# Patient Record
Sex: Female | Born: 1960 | Race: Black or African American | Hispanic: No | Marital: Married | Smoking: Never smoker
Health system: Southern US, Community
[De-identification: ages and names within clinical notes are randomized; demographics above are authoritative.]

## PROBLEM LIST (undated history)

## (undated) DIAGNOSIS — F419 Anxiety disorder, unspecified: Secondary | ICD-10-CM

## (undated) DIAGNOSIS — M1990 Unspecified osteoarthritis, unspecified site: Secondary | ICD-10-CM

## (undated) HISTORY — PX: OTHER SURGICAL HISTORY: SHX169

## (undated) HISTORY — DX: Anxiety disorder, unspecified: F41.9

## (undated) HISTORY — DX: Unspecified osteoarthritis, unspecified site: M19.90

---

## 2016-08-23 ENCOUNTER — Encounter (HOSPITAL_COMMUNITY): Payer: Self-pay | Admitting: Emergency Medicine

## 2016-08-23 ENCOUNTER — Emergency Department (HOSPITAL_COMMUNITY)
Admission: EM | Admit: 2016-08-23 | Discharge: 2016-08-23 | Disposition: A | Discharge status: Home / Self Care | Payer: Self-pay | Attending: Emergency Medicine | Admitting: Emergency Medicine

## 2016-08-23 DIAGNOSIS — M791 Myalgia: Secondary | ICD-10-CM | POA: Insufficient documentation

## 2016-08-23 DIAGNOSIS — R52 Pain, unspecified: Secondary | ICD-10-CM

## 2016-08-23 DIAGNOSIS — M79671 Pain in right foot: Secondary | ICD-10-CM | POA: Insufficient documentation

## 2016-08-23 DIAGNOSIS — R202 Paresthesia of skin: Secondary | ICD-10-CM

## 2016-08-23 DIAGNOSIS — M79672 Pain in left foot: Secondary | ICD-10-CM | POA: Insufficient documentation

## 2016-08-23 DIAGNOSIS — M7918 Myalgia, other site: Secondary | ICD-10-CM

## 2016-08-23 LAB — URINALYSIS, ROUTINE W REFLEX MICROSCOPIC
BILIRUBIN URINE: NEGATIVE
GLUCOSE, UA: NEGATIVE mg/dL
Ketones, ur: NEGATIVE mg/dL
Leukocytes, UA: NEGATIVE
Nitrite: NEGATIVE
PH: 5 (ref 5.0–8.0)
Protein, ur: NEGATIVE mg/dL
SPECIFIC GRAVITY, URINE: 1.025 (ref 1.005–1.030)

## 2016-08-23 LAB — POC URINE PREG, ED: Preg Test, Ur: NEGATIVE

## 2016-08-23 LAB — BASIC METABOLIC PANEL
ANION GAP: 9 (ref 5–15)
BUN: 19 mg/dL (ref 6–20)
CALCIUM: 9.5 mg/dL (ref 8.9–10.3)
CHLORIDE: 104 mmol/L (ref 101–111)
CO2: 28 mmol/L (ref 22–32)
CREATININE: 0.74 mg/dL (ref 0.44–1.00)
GFR calc non Af Amer: >60 mL/min (ref >60)
GLUCOSE: 89 mg/dL (ref 65–99)
Potassium: 4.3 mmol/L (ref 3.5–5.1)
Sodium: 141 mmol/L (ref 135–145)

## 2016-08-23 LAB — CBC
HCT: 41 % (ref 36.0–46.0)
HEMOGLOBIN: 14.1 g/dL (ref 12.0–15.0)
MCH: 31.8 pg (ref 26.0–34.0)
MCHC: 34.4 g/dL (ref 30.0–36.0)
MCV: 92.3 fL (ref 78.0–100.0)
Platelets: 259 10*3/uL (ref 150–400)
RBC: 4.44 MIL/uL (ref 3.87–5.11)
RDW: 13.5 % (ref 11.5–15.5)
WBC: 4.6 10*3/uL (ref 4.0–10.5)

## 2016-08-23 LAB — PREGNANCY, URINE: Preg Test, Ur: NEGATIVE

## 2016-08-23 MED ORDER — METHOCARBAMOL 500 MG PO TABS: 500.0000 mg | ORAL_TABLET | Freq: Two times a day (BID) | ORAL | 0 refills | Status: DC

## 2016-08-23 MED ORDER — ACETAMINOPHEN 500 MG PO TABS
1000.0000 mg | ORAL_TABLET | Freq: Once | ORAL | Status: AC
  Administered 2016-08-23: 1000 mg via ORAL
  Filled 2016-08-23: qty 2

## 2016-08-23 MED ORDER — METHOCARBAMOL 500 MG PO TABS
500.0000 mg | ORAL_TABLET | Freq: Once | ORAL | Status: AC
  Administered 2016-08-23: 500 mg via ORAL
  Filled 2016-08-23: qty 1

## 2016-08-23 NOTE — ED Notes (Signed)
Patient was alert, oriented and stable upon discharge. RN went over AVS and patient had no further questions.  

## 2016-08-23 NOTE — Discharge Instructions (Signed)
Take medications as directed for pain.  Follow-up with one of the referred primary care doctors for further evaluation.  Return the emergency Department for any worsening pain, fever, difficulty walking, difficulty moving her arms or legs. Urinary or bowel accidents, numbness in her groin, chest pain, dental to breathing or any other worsening or concerning symptoms.  If you do not have a primary care doctor you see regularly, please you the list below. Please call them to arrange for follow-up.    No Primary Care Doctor [facility]  [phone] Other agencies that provide inexpensive medical care    [facility]  [phone]    [facility]  [phone]    [facility]  [phone]    [facility]  [phone]    [facility]  [phone]    [facility]  [phone]

## 2016-08-23 NOTE — ED Provider Notes (Signed)
[facility]-EMERGENCY DEPT Provider Note   CSN: [id number] Arrival date & time: 08/23/16  1338     History   Chief Complaint Chief Complaint  Patient presents with  . Weakness  . Foot Pain    HPI Jody Spencer is a 56 y.o. female who presents with intermittent bilateral foot pain that has been ongoing for 9 years. Patient reports that she has also been experiencing intermittent cramping, paresthesia and pain to bilateral feet and bilateral hands. Patient states that the pain is random and is not associated with any particular activity. She describes it as a "prickling sensation and sometimes feels like there are things crawling on me" and states that sometimes her foot cramps up causing pain. Patient also reports having intermittent paresthesia to the bilateral hands and feet. Patient reports that she also sometimes have some bilateral knee pain. She reports that last night the knee pain became worse, causing her legs to give out and fell. She denies any episodes were she has not been able to move or feel her bilateral upper and lower extremities. She denies any recent fall, trauma, injury. She denies any back pain, neck pain. Denies fevers, weight loss, numbness/weakness of upper and lower extremities, bowel/bladder incontinence, saddle anesthesia, history of back surgery, history of IVDA.    The history is provided by the patient.    History reviewed. No pertinent past medical history.  There are no active problems to display for this patient.   History reviewed. No pertinent surgical history.  OB History    No data available       Home Medications    Prior to Admission medications   Medication Sig Start Date End Date Taking? Authorizing Provider  ibuprofen (ADVIL,MOTRIN) 800 MG tablet Take 800 mg by mouth every 8 (eight) hours as needed for moderate pain.   Yes [provider]  methocarbamol (ROBAXIN) 500 MG tablet Take 1 tablet (500 mg total) by mouth 2 (two) times  daily. 08/23/16   Maxwell CaulLayden, Ermel Verne A, PA-C    Family History History reviewed. No pertinent family history.  Social History Social History  Substance Use Topics  . Smoking status: Never Smoker  . Smokeless tobacco: Never Used  . Alcohol use No     Allergies   Patient has no known allergies.   Review of Systems Review of Systems  Constitutional: Negative for chills and fever.  HENT: Negative for congestion, rhinorrhea and sore throat.   Respiratory: Negative for cough and shortness of breath.   Cardiovascular: Negative for chest pain.  Gastrointestinal: Negative for abdominal pain, blood in stool, diarrhea, nausea and vomiting.  Genitourinary: Negative for dysuria and hematuria.  Musculoskeletal: Negative for back pain and neck pain.  Skin: Negative for rash.  Neurological: Positive for numbness. Negative for dizziness, weakness and headaches.     Physical Exam Updated Vital Signs BP 131/86 (BP Location: Left Arm)   Pulse 78   Temp 98.5 F (36.9 C) (Oral)   Resp 16   Ht 5\' 1"  (1.549 m)   Wt 86.2 kg (190 lb)   SpO2 99%   BMI 35.90 kg/m   Physical Exam  Constitutional: She is oriented to person, place, and time. She appears well-developed and well-nourished.  Sitting comfortably on examination table  HENT:  Head: Normocephalic and atraumatic.  Mouth/Throat: Oropharynx is clear and moist and mucous membranes are normal.  Eyes: Conjunctivae, EOM and lids are normal. Pupils are equal, round, and reactive to light.  Neck: Full passive range of  motion without pain.  Full flexion/extension and lateral movement of neck fully intact. No bony midline tenderness. No deformities or crepitus. Negative Spurling's.  Cardiovascular: Normal rate, regular rhythm, normal heart sounds and normal pulses.  Exam reveals no gallop and no friction rub.   No murmur heard. Pulses:      Radial pulses are 2+ on the right side, and 2+ on the left side.       Dorsalis pedis pulses are 2+ on  the right side, and 2+ on the left side.  Pulmonary/Chest: Effort normal and breath sounds normal.  Abdominal: Soft. Normal appearance. There is no tenderness. There is no rigidity and no guarding.  Musculoskeletal: Normal range of motion.  Note C, T, L-spine midline tenderness. No deformities or crepitus noted. Full extension/flexion of back intact without any difficulty. Full range of motion of bilateral lower extremities. Full flexion/extension of bilateral knees. No overlying erythema, ecchymosis, deformity or crepitus noted to bilateral knees. Full range of motion of bilateral ankles. Dorsiflexion and plantarflexion intact without difficulty. No bilateral lower extremity edema. No calf tenderness bilaterally.  Neurological: She is alert and oriented to person, place, and time.  Cranial nerves III-XII intact Follows commands, Moves all extremities  5/5 strength to BUE and BLE  Sensation intact throughout. Patient is able to differentiate between sharp and dull throughout all major nerve distributions of the bilateral upper extremity. Normal finger to nose. No dysdiadochokinesia. No pronator drift. No gait abnormalities  No slurred speech. No facial droop. Negative straight leg raise test.   Skin: Skin is warm and dry. Capillary refill takes less than 2 seconds. No rash noted.  No evidence of rash. Bilateral feet are not cold or dusky in appearance.  Psychiatric: She has a normal mood and affect. Her speech is normal.  Nursing note and vitals reviewed.    ED Treatments / Results  Labs (all labs ordered are listed, but only abnormal results are displayed) Labs Reviewed  URINALYSIS, ROUTINE W REFLEX MICROSCOPIC - Abnormal; Notable for the following:       Result Value   Hgb urine dipstick SMALL (*)    Bacteria, UA RARE (*)    Squamous Epithelial / LPF 0-5 (*)    All other components within normal limits  BASIC METABOLIC PANEL  CBC  PREGNANCY, URINE  POC URINE PREG, ED    EKG   EKG Interpretation None       Radiology No results found.  Procedures Procedures (including critical care time)  Medications Ordered in ED Medications  acetaminophen (TYLENOL) tablet 1,000 mg (1,000 mg Oral Given 08/23/16 1658)  methocarbamol (ROBAXIN) tablet 500 mg (500 mg Oral Given 08/23/16 1800)     Initial Impression / Assessment and Plan / ED Course  I have reviewed the triage vital signs and the nursing notes.  Pertinent labs & imaging results that were available during my care of the patient were reviewed by me and considered in my medical decision making (see chart for details).     56 year old female who presents with 9 years of chronic foot pain, cramping, paresthesia. Patient is afebrile, non-toxic appearing, sitting comfortably on examination table. Vital signs reviewed and stable. No history of trauma or fall. No red flag warning symptoms on exam. No neuro deficits on exam. Patient is able to walk the department without any difficulty. There is mention of triage note of patient being weak. When asked about this patient tells me that she had one episode last night where her leg  pain became severe and caught her off guard that it caused her legs to give out. She denies any other episodes of weakness. History/physical exam are not concerning for cauda equina, spinal abscess, DVT, Septic arthritis, acute arterial embolism. Patient does not have pain over specific bone or joint. Her pain is mostly diffuse. Low suspicion for acute fracture dislocation, especially given lack of fall or trauma.  Consider musculoskeletal pain versus electrolyte abnormality versus radiculopathy versus sciatica. Basic labs including, CBC, BMP, UA ordered at triage. Will plan to obtain urine pregnancy. Patient given analgesics in the department.  Labs and imaging reviewed. CMP without any abnormalities. CBC unremarkable. Urine shows small bacteria but likely contaminated specimen. Urine pregnancy  negative. Discussed results with patient. She reports improvement in pain after Tylenol use. We discussed at length regarding her symptoms and reassuring exam. She does not have primary care doctor to follow-up with. Provided patient with a list of clinic resources to use if he does not have a PCP. Instructed to call them today to arrange follow-up in the next 24-48 hours. Return precautions discussed. Patient expresses understanding and agreement to plan.     Final Clinical Impressions(s) / ED Diagnoses   Final diagnoses:  Foot pain, bilateral  Paresthesia  Generalized pain  Musculoskeletal pain    New Prescriptions New Prescriptions   METHOCARBAMOL (ROBAXIN) 500 MG TABLET    Take 1 tablet (500 mg total) by mouth 2 (two) times daily.     Maxwell Caul, PA-C 08/24/16 2138    Rolland Porter, MD 08/30/16 [phone]

## 2016-08-23 NOTE — ED Triage Notes (Signed)
Patient complaining of foot pain that started 9 years ago. Patient states that it feels like something is pricking her. Patient also states that her legs are really weak and she been loosing her balance.

## 2018-01-28 ENCOUNTER — Emergency Department (HOSPITAL_COMMUNITY)
Admission: EM | Admit: 2018-01-28 | Discharge: 2018-01-28 | Disposition: A | Discharge status: Home / Self Care | Payer: Self-pay | Attending: Emergency Medicine | Admitting: Emergency Medicine

## 2018-01-28 ENCOUNTER — Encounter (HOSPITAL_COMMUNITY): Payer: Self-pay | Admitting: Physician Assistant

## 2018-01-28 ENCOUNTER — Encounter (HOSPITAL_COMMUNITY): Payer: Self-pay | Admitting: Emergency Medicine

## 2018-01-28 DIAGNOSIS — Y33XXXA Other specified events, undetermined intent, initial encounter: Secondary | ICD-10-CM | POA: Insufficient documentation

## 2018-01-28 DIAGNOSIS — Y9389 Activity, other specified: Secondary | ICD-10-CM | POA: Insufficient documentation

## 2018-01-28 DIAGNOSIS — S161XXA Strain of muscle, fascia and tendon at neck level, initial encounter: Secondary | ICD-10-CM | POA: Insufficient documentation

## 2018-01-28 DIAGNOSIS — Y9289 Other specified places as the place of occurrence of the external cause: Secondary | ICD-10-CM | POA: Insufficient documentation

## 2018-01-28 DIAGNOSIS — Y990 Civilian activity done for income or pay: Secondary | ICD-10-CM | POA: Insufficient documentation

## 2018-01-28 MED ORDER — CYCLOBENZAPRINE HCL 10 MG PO TABS: 10.0000 mg | ORAL_TABLET | Freq: Two times a day (BID) | ORAL | 0 refills | Status: DC | PRN

## 2018-01-28 NOTE — ED Provider Notes (Signed)
[facility] EMERGENCY DEPARTMENT Provider Note   CSN: [id number] Arrival date & time: 01/28/18  0865     History   Chief Complaint No chief complaint on file.   HPI Jody Spencer is a 57 y.o. female.  HPI   57 year old female presents today with left-sided neck pain.  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  She notes this slowly developed with worsening pain and stiffness.  She notes that when she moves her neck in any direction she has pain in the left side of her neck.  She denies any posterior midline tenderness, denies any involvement of the head, no distal neurological deficits.  She notes taking a unknown muscle relaxer at home along with Goody powders with no significant improvement in symptoms.  History reviewed. No pertinent past medical history.  There are no active problems to display for this patient.   History reviewed. No pertinent surgical history.   OB History   No obstetric history on file.      Home Medications    Prior to Admission medications   Medication Sig Start Date End Date Taking? Authorizing Provider  cyclobenzaprine (FLEXERIL) 10 MG tablet Take 1 tablet (10 mg total) by mouth 2 (two) times daily as needed for muscle spasms. 01/28/18   Eyvonne Mechanic, PA-C    Family History History reviewed. No pertinent family history.  Social History Social History   Tobacco Use  . Smoking status: Not on file  Substance Use Topics  . Alcohol use: Not on file  . Drug use: Not on file     Allergies   Patient has no allergy information on record.   Review of Systems Review of Systems  All other systems reviewed and are negative.    Physical Exam Updated Vital Signs BP (!) 142/88   Pulse 95   Temp 98.8 F (37.1 C) (Oral)   Resp 18   SpO2 98%   Physical Exam Vitals signs and nursing note reviewed.  Constitutional:      Appearance: She is  well-developed.  HENT:     Head: Normocephalic and atraumatic.  Eyes:     General: No scleral icterus.       Right eye: No discharge.        Left eye: No discharge.     Conjunctiva/sclera: Conjunctivae normal.     Pupils: Pupils are equal, round, and reactive to light.  Neck:     Musculoskeletal: Normal range of motion.     Vascular: No JVD.     Trachea: No tracheal deviation.  Pulmonary:     Effort: Pulmonary effort is normal.     Breath sounds: No stridor.  Musculoskeletal:     Comments: Tenderness palpation of left lateral cervical musculature, no midline tenderness, no swelling or obvious warmness of the neck-bilateral upper extremity sensation and motor function intact radial pulse 2+  Neurological:     Mental Status: She is alert and oriented to person, place, and time.     Coordination: Coordination normal.  Psychiatric:        Behavior: Behavior normal.        Thought Content: Thought content normal.        Judgment: Judgment normal.      ED Treatments / Results  Labs (all labs ordered are listed, but only abnormal results are displayed) Labs Reviewed - No data to display  EKG None  Radiology No results found.  Procedures Procedures (including critical care time)  Medications Ordered in ED Medications - No data to display   Initial Impression / Assessment and Plan / ED Course  I have reviewed the triage vital signs and the nursing notes.  Pertinent labs & imaging results that were available during my care of the patient were reviewed by me and considered in my medical decision making (see chart for details).     57 year old female presents today with likely cervical strain.  I have very low suspicion for vascular or significant neurological involvement.  Symptomatic care instructions given outpatient follow-up information given.  Patient verbalized understanding and agreement to today's plan had no further questions or concerns.  Final Clinical  Impressions(s) / ED Diagnoses   Final diagnoses:  Strain of neck muscle, initial encounter    ED Discharge Orders         Ordered    cyclobenzaprine (FLEXERIL) 10 MG tablet  2 times daily PRN     01/28/18 0753           Eyvonne MechanicHedges, Ramonica Grigg, PA-C 01/28/18 [id number]    Rolan BuccoBelfi, Melanie, MD 01/28/18 [phone]

## 2018-01-28 NOTE — ED Triage Notes (Signed)
Pt arrive via POV d/t work related injury. 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

## 2018-01-28 NOTE — Discharge Instructions (Signed)
Please read the attached information.  Please follow-up with either your primary care provider or workplace health care center for further evaluation and management this week, return immediately if you develop any new or worsening signs or symptoms.

## 2018-02-18 ENCOUNTER — Ambulatory Visit: Payer: Self-pay

## 2018-02-18 ENCOUNTER — Other Ambulatory Visit: Payer: Self-pay | Admitting: Family Medicine

## 2018-02-18 DIAGNOSIS — M542 Cervicalgia: Secondary | ICD-10-CM

## 2018-03-26 ENCOUNTER — Emergency Department (HOSPITAL_COMMUNITY)
Admission: EM | Admit: 2018-03-26 | Discharge: 2018-03-26 | Disposition: A | Discharge status: Home / Self Care | Payer: Medicaid Other | Attending: Emergency Medicine | Admitting: Emergency Medicine

## 2018-03-26 ENCOUNTER — Encounter (HOSPITAL_COMMUNITY): Payer: Self-pay

## 2018-03-26 ENCOUNTER — Emergency Department (HOSPITAL_COMMUNITY): Payer: Medicaid Other

## 2018-03-26 DIAGNOSIS — J069 Acute upper respiratory infection, unspecified: Secondary | ICD-10-CM

## 2018-03-26 LAB — GROUP A STREP BY PCR: Group A Strep by PCR: NOT DETECTED

## 2018-03-26 MED ORDER — ALBUTEROL SULFATE HFA 108 (90 BASE) MCG/ACT IN AERS: 1.0000 | INHALATION_SPRAY | Freq: Four times a day (QID) | RESPIRATORY_TRACT | 0 refills | Status: AC | PRN

## 2018-03-26 MED ORDER — AZITHROMYCIN 250 MG PO TABS: 250.0000 mg | ORAL_TABLET | Freq: Every day | ORAL | 0 refills | Status: DC

## 2018-03-26 MED ORDER — KETOROLAC TROMETHAMINE 15 MG/ML IJ SOLN
15.0000 mg | Freq: Once | INTRAMUSCULAR | Status: AC
Start: 2018-03-26 — End: 2018-03-26
  Administered 2018-03-26: 15 mg via INTRAMUSCULAR
  Filled 2018-03-26: qty 1

## 2018-03-26 NOTE — ED Notes (Signed)
Patient verbalizes understanding of discharge instructions. Opportunity for questioning and answers were provided. Armband removed by staff, pt discharged from ED.  

## 2018-03-26 NOTE — ED Triage Notes (Signed)
Pt reports flu like symptoms (NP cough, sore throat, headache, body aches, nausea) for 3 days. NAD noted in triage

## 2018-03-26 NOTE — ED Provider Notes (Signed)
[facility] EMERGENCY DEPARTMENT Provider Note   CSN: [id number] Arrival date & time: 03/26/18  1052     History   Chief Complaint Chief Complaint  Patient presents with  . Generalized Body Aches  . Cough  . Headache    HPI Jody Spencer is a 58 y.o. female.  58 year old female with prior medical history as detailed below presents for evaluation of URI symptoms.  Patient reports 2 days of congestion, cough, sore throat, malaise, fatigue, subjective fever, and chills.  Patient is medicating at home with Tylenol and Motrin.  Patient reports that she has had intermittent episodes of wheezing as well.  She denies current shortness of breath.  She denies nausea, vomiting, abdominal pain, chest pain, bowel movement change, or urinary symptoms.  Patient is requesting a course of antibiotics for treatment of her symptoms.  The history is provided by the patient and medical records.  Cough  Cough characteristics:  Dry Sputum characteristics:  Nondescript Severity:  Mild Onset quality:  Gradual Duration:  2 days Timing:  Constant Progression:  Waxing and waning Chronicity:  New Smoker: no   Relieved by:  Nothing Worsened by:  Nothing Ineffective treatments:  None tried Associated symptoms: headaches   Headache  Associated symptoms: cough     History reviewed. No pertinent past medical history.  There are no active problems to display for this patient.   History reviewed. No pertinent surgical history.   OB History   No obstetric history on file.      Home Medications    Prior to Admission medications   Medication Sig Start Date End Date Taking? Authorizing Provider  albuterol (PROVENTIL HFA;VENTOLIN HFA) 108 (90 Base) MCG/ACT inhaler Inhale 1-2 puffs into the lungs every 6 (six) hours as needed for wheezing or shortness of breath. 03/26/18   Wynetta Fines, MD  azithromycin (ZITHROMAX) 250 MG tablet Take 1 tablet (250 mg total) by mouth daily.  Take first 2 tablets together, then 1 every day until finished. 03/26/18   Wynetta Fines, MD  cyclobenzaprine (FLEXERIL) 10 MG tablet Take 1 tablet (10 mg total) by mouth 2 (two) times daily as needed for muscle spasms. 01/28/18   Hedges, Tinnie Gens, PA-C  ibuprofen (ADVIL,MOTRIN) 800 MG tablet Take 800 mg by mouth every 8 (eight) hours as needed for moderate pain.    [provider]  methocarbamol (ROBAXIN) 500 MG tablet Take 1 tablet (500 mg total) by mouth 2 (two) times daily. 08/23/16   Maxwell Caul, PA-C    Family History No family history on file.  Social History Social History   Tobacco Use  . Smoking status: Never Smoker  Substance Use Topics  . Alcohol use: No  . Drug use: No     Allergies   Patient has no allergy information on record.   Review of Systems Review of Systems  Respiratory: Positive for cough.   Neurological: Positive for headaches.  All other systems reviewed and are negative.    Physical Exam Updated Vital Signs BP 104/66 (BP Location: Right Arm)   Pulse 88   Temp 99.7 F (37.6 C) (Oral)   Resp 20   SpO2 96%   Physical Exam Vitals signs and nursing note reviewed.  Constitutional:      General: She is not in acute distress.    Appearance: She is well-developed.  HENT:     Head: Normocephalic and atraumatic.     Mouth/Throat:     Comments: Mild posterior pharyngeal  erythema.  No exudates.  No peritonsillar abscess noted. Eyes:     General: No visual field deficit.    Extraocular Movements: Extraocular movements intact.     Conjunctiva/sclera: Conjunctivae normal.     Pupils: Pupils are equal, round, and reactive to light.  Neck:     Musculoskeletal: Normal range of motion and neck supple.  Cardiovascular:     Rate and Rhythm: Normal rate and regular rhythm.     Heart sounds: Normal heart sounds.  Pulmonary:     Effort: Pulmonary effort is normal. No respiratory distress.     Breath sounds: Normal breath sounds.    Abdominal:     General: There is no distension.     Palpations: Abdomen is soft.     Tenderness: There is no abdominal tenderness.  Musculoskeletal: Normal range of motion.        General: No deformity.  Skin:    General: Skin is warm and dry.  Neurological:     Mental Status: She is alert and oriented to person, place, and time. Mental status is at baseline.     GCS: GCS eye subscore is 4. GCS verbal subscore is 5. GCS motor subscore is 6.     Cranial Nerves: No cranial nerve deficit, dysarthria or facial asymmetry.  Psychiatric:        Mood and Affect: Mood normal.      ED Treatments / Results  Labs (all labs ordered are listed, but only abnormal results are displayed) Labs Reviewed  GROUP A STREP BY PCR    EKG None  Radiology Dg Chest 2 View  Result Date: 03/26/2018 CLINICAL DATA:  Cough and congestion. EXAM: CHEST - 2 VIEW COMPARISON:  None. FINDINGS: The lungs are clear. Heart size is normal. No pneumothorax or pleural effusion. No acute or focal bony abnormality. Thoracic spondylosis noted. IMPRESSION: No acute disease. Electronically Signed   By: Drusilla Kannerhomas  Dalessio M.D.   On: 03/26/2018 11:58    Procedures Procedures (including critical care time)  Medications Ordered in ED Medications  ketorolac (TORADOL) 15 MG/ML injection 15 mg (has no administration in time range)     Initial Impression / Assessment and Plan / ED Course  I have reviewed the triage vital signs and the nursing notes.  Pertinent labs & imaging results that were available during my care of the patient were reviewed by me and considered in my medical decision making (see chart for details).     MDM  Screen complete  Patient is presenting for evaluation of symptoms likely secondary to a viral upper respiratory infection.  Patient without evidence of more significant acute pathology on work-up or screening exam.  Patient does report intermittent episodes of wheezing.  She is willing to  trial albuterol for symptom medic treatment.  She is not currently wheezing and is unlikely to benefit from prednisone burst.  Patient is insistent upon requesting antibiotic course for treatment of her viral syndrome.  Patient is advised that the antibiotics will not help.  Given patient's persistence will prescribe a course of a Z-Pak.  She is advised to at least give herself 2 to 3 days to see if she improves prior to taking the antibiotics.  Importance of close follow-up was stressed.  Strict return precautions given and understood.  Final Clinical Impressions(s) / ED Diagnoses   Final diagnoses:  Viral upper respiratory tract infection    ED Discharge Orders         Ordered    albuterol (  PROVENTIL HFA;VENTOLIN HFA) 108 (90 Base) MCG/ACT inhaler  Every 6 hours PRN     03/26/18 1237    azithromycin (ZITHROMAX) 250 MG tablet  Daily     03/26/18 1237           Wynetta Fines, MD 03/26/18 1250

## 2018-03-26 NOTE — Discharge Instructions (Signed)
Return for any problem.  Follow-up with your regular care providers as instructed.  Use Tylenol, Motrin, and drink plenty of fluids for treatment of your symptoms.

## 2018-09-06 ENCOUNTER — Other Ambulatory Visit: Payer: Self-pay

## 2018-09-06 ENCOUNTER — Emergency Department (HOSPITAL_COMMUNITY): Payer: Self-pay

## 2018-09-06 ENCOUNTER — Emergency Department (HOSPITAL_COMMUNITY)
Admission: EM | Admit: 2018-09-06 | Discharge: 2018-09-06 | Disposition: A | Discharge status: Home / Self Care | Payer: Self-pay | Attending: Emergency Medicine | Admitting: Emergency Medicine

## 2018-09-06 DIAGNOSIS — M545 Low back pain, unspecified: Secondary | ICD-10-CM

## 2018-09-06 DIAGNOSIS — Z79899 Other long term (current) drug therapy: Secondary | ICD-10-CM | POA: Diagnosis not present

## 2018-09-06 DIAGNOSIS — M546 Pain in thoracic spine: Secondary | ICD-10-CM | POA: Diagnosis present

## 2018-09-06 MED ORDER — LIDOCAINE 5 % EX PTCH
2.0000 | MEDICATED_PATCH | CUTANEOUS | Status: DC
  Administered 2018-09-06: 12:00:00 2 via TRANSDERMAL
  Filled 2018-09-06: qty 2

## 2018-09-06 MED ORDER — METHOCARBAMOL 500 MG PO TABS: 500.0000 mg | ORAL_TABLET | Freq: Two times a day (BID) | ORAL | 0 refills | Status: DC

## 2018-09-06 MED ORDER — NAPROXEN 500 MG PO TABS: 500.0000 mg | ORAL_TABLET | Freq: Two times a day (BID) | ORAL | 0 refills | Status: DC

## 2018-09-06 MED ORDER — NAPROXEN 250 MG PO TABS
500.0000 mg | ORAL_TABLET | Freq: Once | ORAL | Status: AC
  Administered 2018-09-06: 12:00:00 500 mg via ORAL
  Filled 2018-09-06: qty 2

## 2018-09-06 NOTE — Discharge Instructions (Signed)

## 2018-09-06 NOTE — ED Notes (Signed)
Patient transported to X-ray 
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
## 2018-09-06 NOTE — ED Notes (Signed)
Scanner not working in room

## 2018-09-06 NOTE — ED Notes (Signed)
Patient back in room from xray. No distress observed.

## 2019-04-30 ENCOUNTER — Other Ambulatory Visit: Payer: Self-pay

## 2019-04-30 DIAGNOSIS — Z1231 Encounter for screening mammogram for malignant neoplasm of breast: Secondary | ICD-10-CM

## 2019-05-15 ENCOUNTER — Ambulatory Visit: Payer: Medicaid Other

## 2019-06-05 ENCOUNTER — Other Ambulatory Visit: Payer: Self-pay

## 2019-06-05 ENCOUNTER — Ambulatory Visit: Payer: Medicaid Other

## 2019-06-05 ENCOUNTER — Ambulatory Visit
Admission: RE | Admit: 2019-06-05 | Discharge: 2019-06-05 | Disposition: A | Discharge status: Home / Self Care | Payer: No Typology Code available for payment source | Source: Ambulatory Visit | Attending: Obstetrics and Gynecology | Admitting: Obstetrics and Gynecology

## 2019-06-05 DIAGNOSIS — Z1231 Encounter for screening mammogram for malignant neoplasm of breast: Secondary | ICD-10-CM

## 2019-06-09 ENCOUNTER — Telehealth: Payer: Self-pay

## 2019-06-09 NOTE — Telephone Encounter (Signed)
Patient called and left message on voicemail, requesting a return call. Left message on identifying voicemail requesting patient to return call to office.

## 2021-07-02 ENCOUNTER — Ambulatory Visit (HOSPITAL_COMMUNITY)
Admission: EM | Admit: 2021-07-02 | Discharge: 2021-07-02 | Disposition: A | Discharge status: Home / Self Care | Payer: No Typology Code available for payment source | Attending: Physician Assistant | Admitting: Physician Assistant

## 2021-07-02 ENCOUNTER — Encounter (HOSPITAL_COMMUNITY): Payer: Self-pay | Admitting: *Deleted

## 2021-07-02 DIAGNOSIS — L03213 Periorbital cellulitis: Secondary | ICD-10-CM

## 2021-07-02 MED ORDER — TETRACAINE HCL 0.5 % OP SOLN
OPHTHALMIC | Status: AC
  Filled 2021-07-02: qty 4

## 2021-07-02 MED ORDER — AMOXICILLIN-POT CLAVULANATE 875-125 MG PO TABS: 1.0000 | ORAL_TABLET | Freq: Two times a day (BID) | ORAL | 0 refills | Status: AC

## 2021-07-02 NOTE — ED Provider Notes (Signed)
[facility]    CSN: [id number] Arrival date & time: 07/02/21  1357      History   Chief Complaint Chief Complaint  Patient presents with   Eye Problem    HPI Jody Spencer is a 61 y.o. female.   61 yo pleasant patient presents with left lower eyelid irritation, swelling, redness x 3 days. Started after applying glue for eyelashes. She has been trying ibuprofen and hot compresses with some relief, but wakes up in the mornings with symptoms returning. Vision is not affected at all per patient. No contact lenses. No pain with eye movements. No fevers or eye discharge.   History reviewed. No pertinent past medical history.  There are no problems to display for this patient.   History reviewed. No pertinent surgical history.  OB History   No obstetric history on file.      Home Medications    Prior to Admission medications   Medication Sig Start Date End Date Taking? Authorizing Provider  amoxicillin-clavulanate (AUGMENTIN) 875-125 MG tablet Take 1 tablet by mouth 2 (two) times daily for 7 days. Take with food. 07/02/21 07/09/21 Yes Sipriano Fendley M, PA-C  ibuprofen (ADVIL,MOTRIN) 800 MG tablet Take 800 mg by mouth every 8 (eight) hours as needed for moderate pain.   Yes [provider]  albuterol (PROVENTIL HFA;VENTOLIN HFA) 108 (90 Base) MCG/ACT inhaler Inhale 1-2 puffs into the lungs every 6 (six) hours as needed for wheezing or shortness of breath. 03/26/18   Wynetta Fines, MD  azithromycin (ZITHROMAX) 250 MG tablet Take 1 tablet (250 mg total) by mouth daily. Take first 2 tablets together, then 1 every day until finished. 03/26/18   Wynetta Fines, MD  cyclobenzaprine (FLEXERIL) 10 MG tablet Take 1 tablet (10 mg total) by mouth 2 (two) times daily as needed for muscle spasms. 01/28/18   Hedges, Tinnie Gens, PA-C  methocarbamol (ROBAXIN) 500 MG tablet Take 1 tablet (500 mg total) by mouth 2 (two) times daily. 09/06/18   Dartha Lodge, PA-C  naproxen  (NAPROSYN) 500 MG tablet Take 1 tablet (500 mg total) by mouth 2 (two) times daily. 09/06/18   Dartha Lodge, PA-C    Family History Family History  Problem Relation Age of Onset   Breast cancer Cousin     Social History Social History   Tobacco Use   Smoking status: Never   Smokeless tobacco: Never  Vaping Use   Vaping Use: Never used  Substance Use Topics   Alcohol use: Yes    Comment: occasionally wine   Drug use: No     Allergies   Patient has no known allergies.   Review of Systems Review of Systems REFER TO HPI FOR PERTINENT POSITIVES AND NEGATIVES   Physical Exam Triage Vital Signs ED Triage Vitals  Enc Vitals Group     BP 07/02/21 1409 132/78     Pulse Rate 07/02/21 1409 71     Resp 07/02/21 1409 20     Temp 07/02/21 1409 97.6 F (36.4 C)     Temp Source 07/02/21 1409 Oral     SpO2 07/02/21 1409 100 %     Weight --      Height --      Head Circumference --      Peak Flow --      Pain Score 07/02/21 1411 8     Pain Loc --      Pain Edu? --      Excl. in  GC? --    No data found.  Updated Vital Signs BP 132/78   Pulse 71   Temp 97.6 F (36.4 C) (Oral)   Resp 20   SpO2 100%   Visual Acuity Right Eye Distance: 20/40 Left Eye Distance: 20/40 Bilateral Distance: 20/30  Right Eye Near:   Left Eye Near:    Bilateral Near:     Physical Exam Constitutional:      Appearance: Normal appearance.  HENT:     Nose:     Right Sinus: No maxillary sinus tenderness.     Left Sinus: Maxillary sinus tenderness present.  Eyes:     General: Vision grossly intact. No visual field deficit.       Right eye: No foreign body or discharge.        Left eye: No foreign body or discharge.     Extraocular Movements: Extraocular movements intact.     Right eye: Normal extraocular motion and no nystagmus.     Left eye: Normal extraocular motion and no nystagmus.     Conjunctiva/sclera:     Right eye: Right conjunctiva is not injected. No exudate.    Left  eye: Left conjunctiva is not injected. No exudate.    Comments: Left lower eyelid swelling and erythema   Neurological:     Mental Status: She is alert.     UC Treatments / Results  Labs (all labs ordered are listed, but only abnormal results are displayed) Labs Reviewed - No data to display  EKG   Radiology No results found.  Procedures Procedures (including critical care time)  Medications Ordered in UC Medications - No data to display  Initial Impression / Assessment and Plan / UC Course  I have reviewed the triage vital signs and the nursing notes.  Pertinent labs & imaging results that were available during my care of the patient were reviewed by me and considered in my medical decision making (see chart for details).   Left lower eyelid preseptal cellulitis secondary to recent lash glue. Vision intact. Concern for infection beginning to extend into left maxillary sinus cavity given pain and edema over this region. Will start on Augmentin as directed. Warm compresses for comfort. OcuSOft lid scrubs. Instructed to stop wearing lashes until this resolves.  Very low threshold for ED if sudden severe pain or swelling, vision changes, fevers, or other acute change in symptoms.   Final Clinical Impressions(s) / UC Diagnoses   Final diagnoses:  Preseptal cellulitis of left lower eyelid     Discharge Instructions      Take the augmentin as directed Try OcuSoft lid scrubs (over the counter) Stop wearing lashes until resolved, stop altogether if recurrent ED right away if any severe swelling, pain, vision changes, fever, or other      ED Prescriptions     Medication Sig Dispense Auth. Provider   amoxicillin-clavulanate (AUGMENTIN) 875-125 MG tablet Take 1 tablet by mouth 2 (two) times daily for 7 days. Take with food. 14 tablet Stephanie Mcglone M, PA-C      PDMP not reviewed this encounter.   AllwardtCrist Infante, PA-C 07/02/21 1454

## 2021-07-02 NOTE — ED Triage Notes (Signed)
C/O left lower eyelid irritation, swelling, redness, x approx 3 days; states started approx 1 day after applying eye glue for eyelashes. Denies vision changes. Does not wear corrective lenses. States actual left eye globe is unremarkable.

## 2021-07-02 NOTE — Discharge Instructions (Signed)
Take the augmentin as directed Try OcuSoft lid scrubs (over the counter) Stop wearing lashes until resolved, stop altogether if recurrent ED right away if any severe swelling, pain, vision changes, fever, or other

## 2021-09-23 IMAGING — MG DIGITAL SCREENING BILAT W/ TOMO W/ CAD
8 series · 8 of 24 positions shown · non-contrast
Comparison: Previous exam(s).

CLINICAL DATA: Screening.

EXAM:
DIGITAL SCREENING BILATERAL MAMMOGRAM WITH TOMO AND CAD

[L MLO synth-2D]
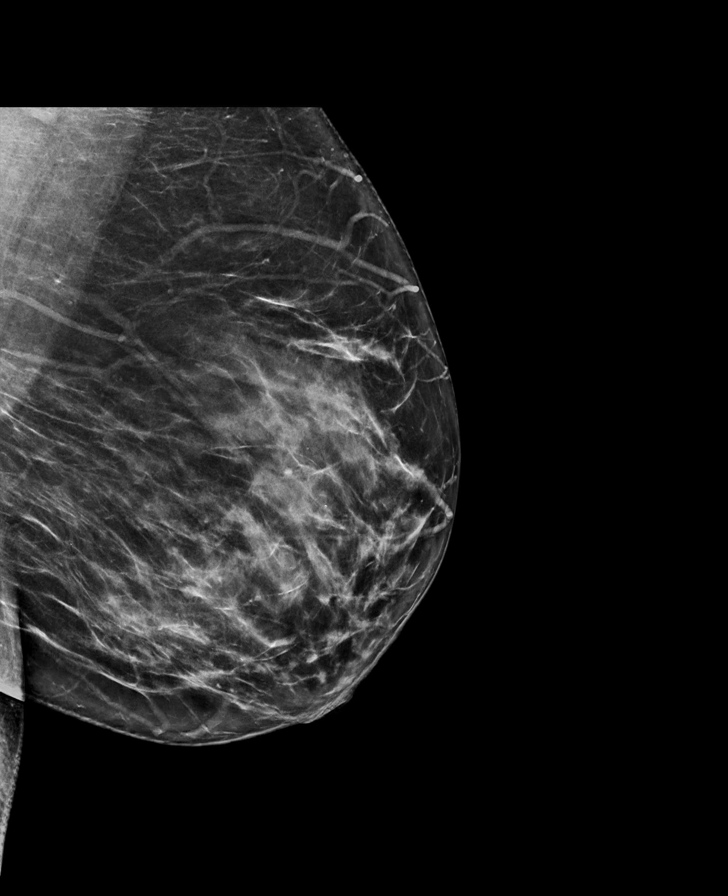

[R CC synth-2D]
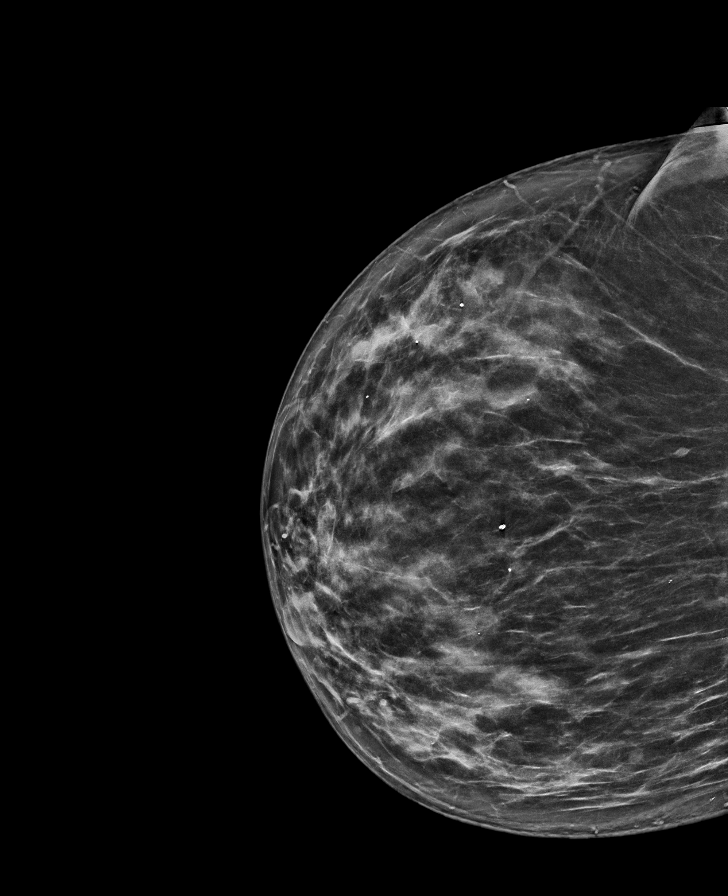

[R MLO synth-2D]
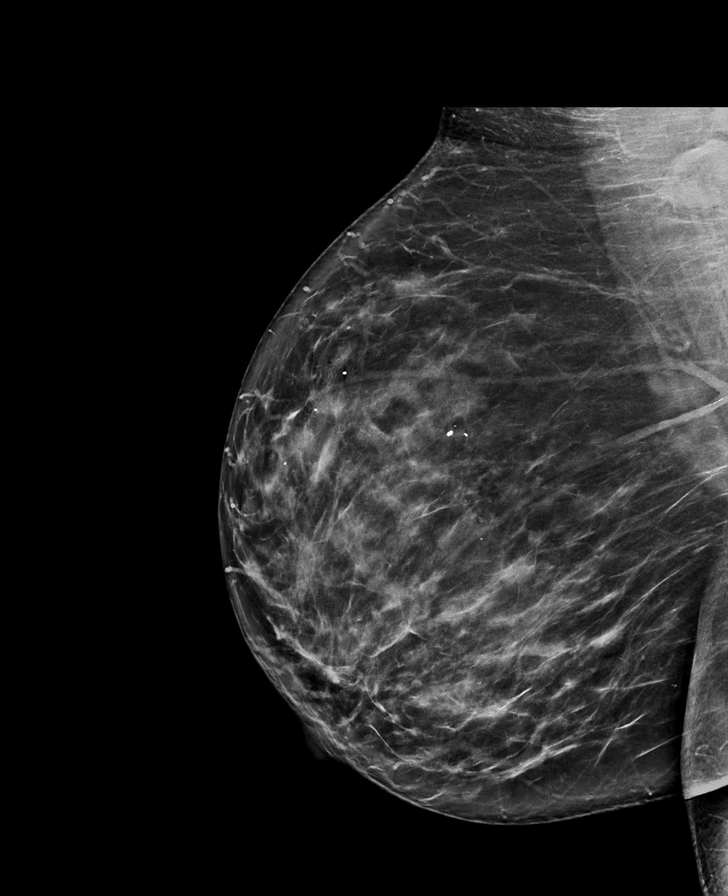

[L CC synth-2D]
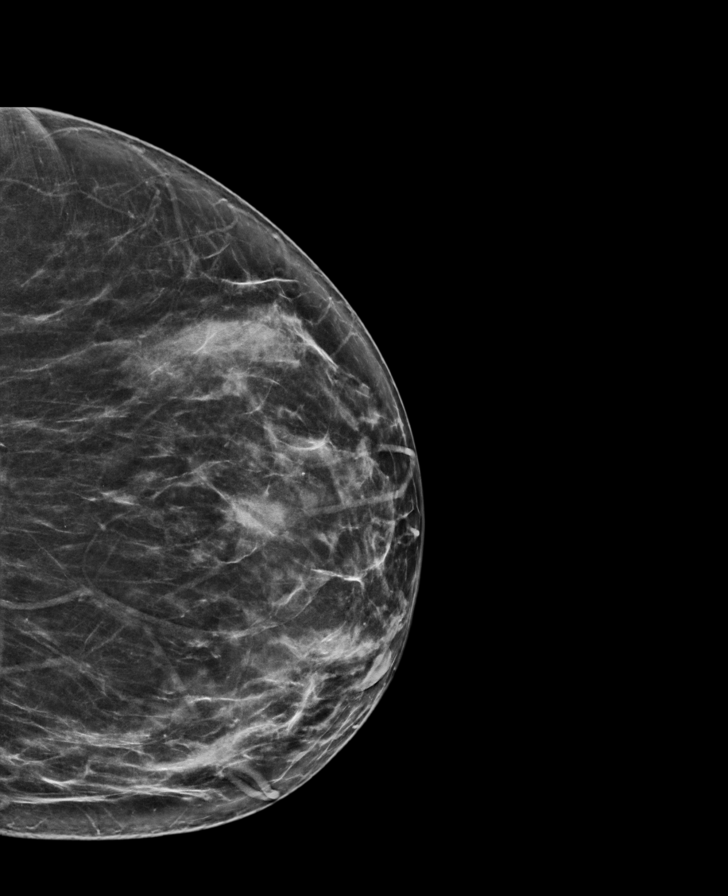

[R MLO tomo · tomo slice 43/86.0]
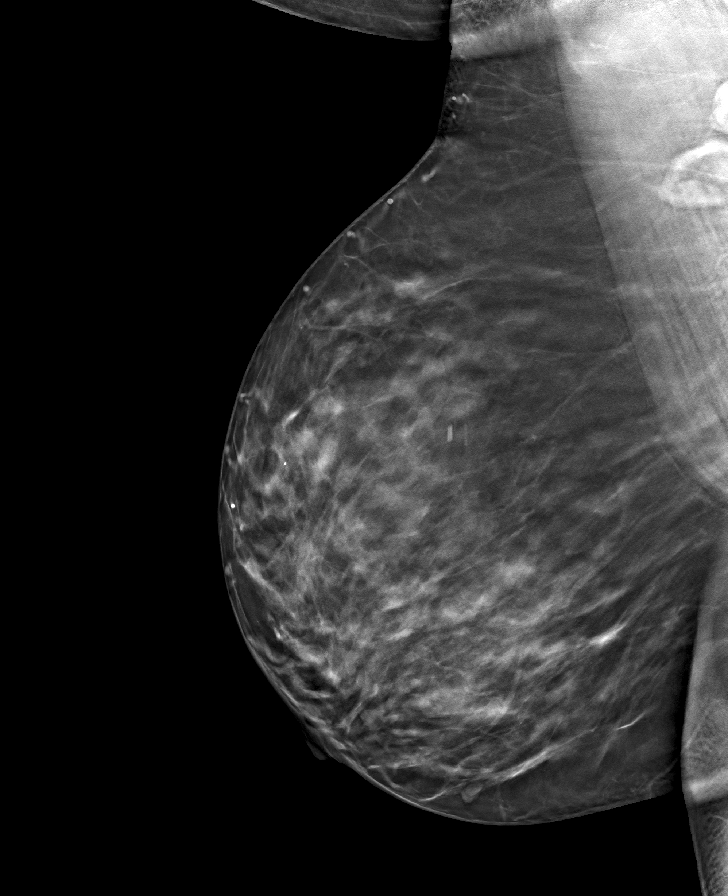

[L CC tomo · tomo slice 37/72.0]
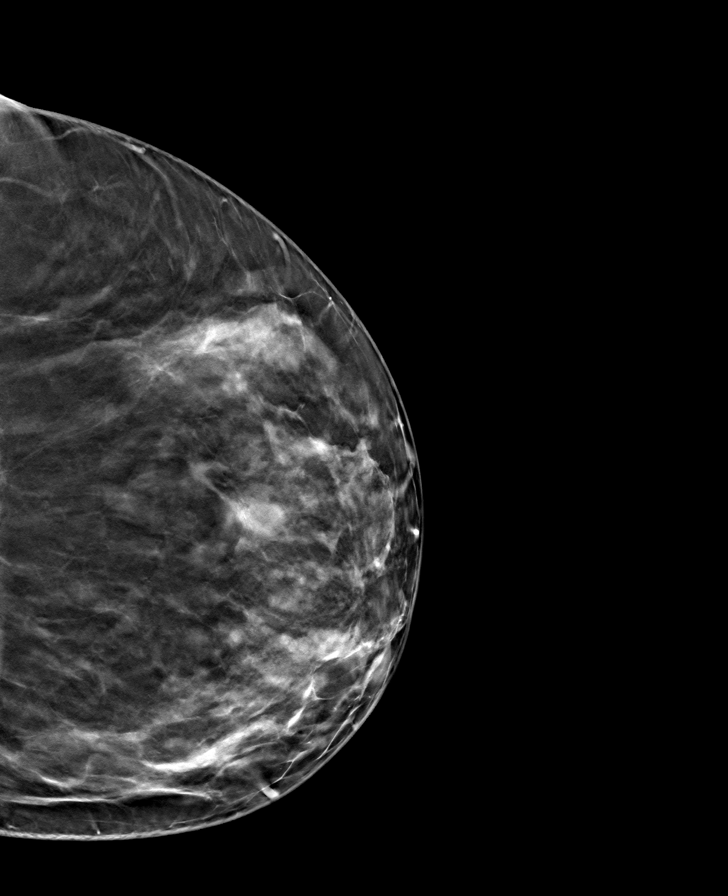

[L MLO tomo · tomo slice 43/84.0]
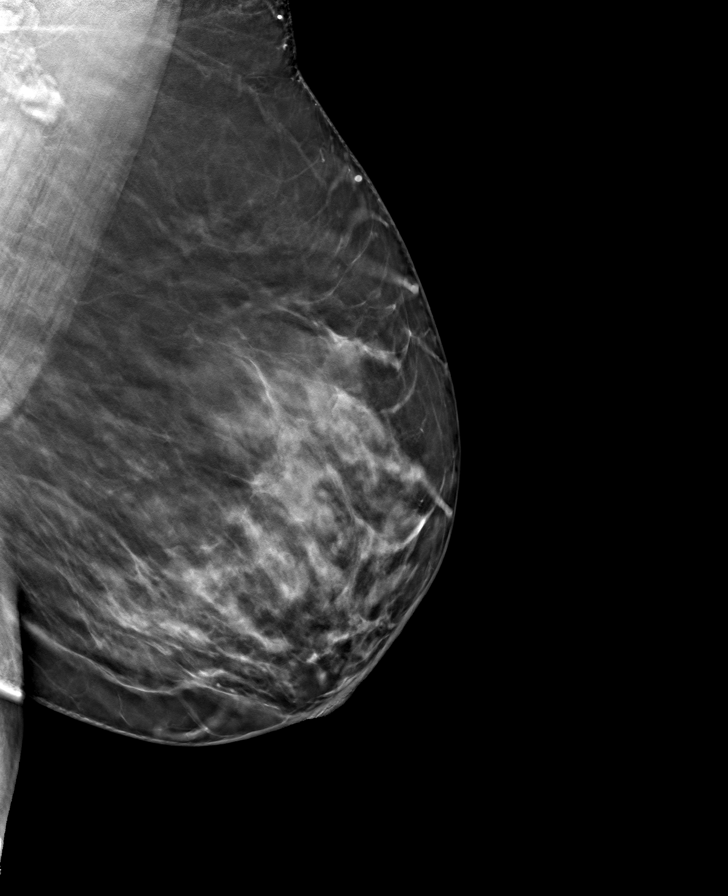

[R CC tomo · tomo slice 39/77.0]
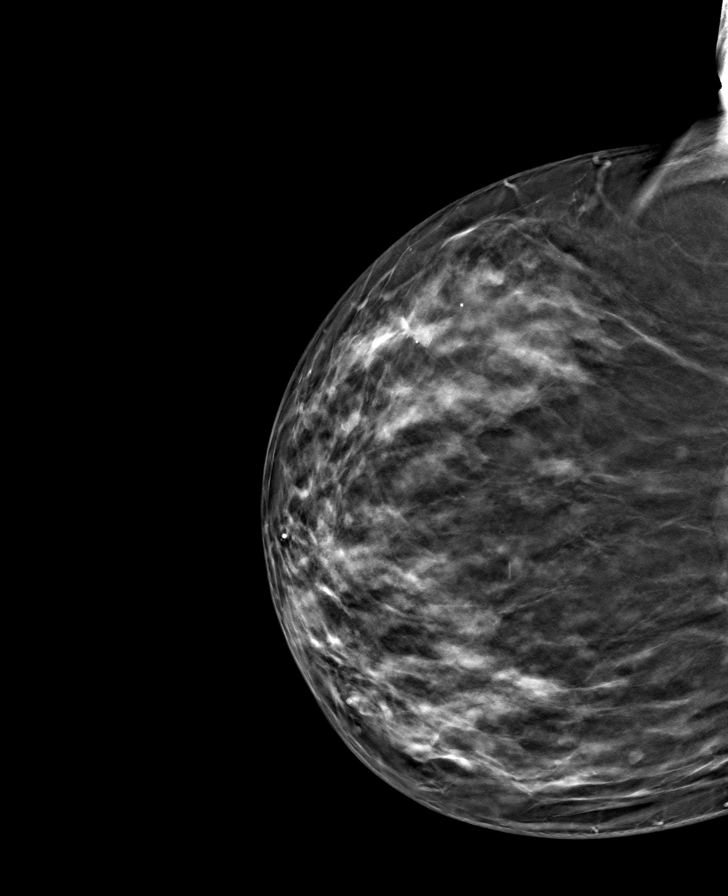

[8 of 24 positions shown; findings below may reference images not displayed]

ACR Breast Density Category c: The breast tissue is heterogeneously
dense, which may obscure small masses.
FINDINGS: There are no findings suspicious for malignancy. Images were
processed with CAD.
IMPRESSION: No mammographic evidence of malignancy. A result letter of this
screening mammogram will be mailed directly to the patient.

RECOMMENDATION:
Screening mammogram in one year. (Code:FT-U-LHB)

BI-RADS CATEGORY  1: Negative.

## 2021-10-13 ENCOUNTER — Ambulatory Visit (HOSPITAL_COMMUNITY)
Admission: EM | Admit: 2021-10-13 | Discharge: 2021-10-13 | Disposition: A | Discharge status: Home / Self Care | Payer: Self-pay | Attending: Family Medicine | Admitting: Family Medicine

## 2021-10-13 ENCOUNTER — Encounter (HOSPITAL_COMMUNITY): Payer: Self-pay | Admitting: Emergency Medicine

## 2021-10-13 DIAGNOSIS — R3121 Asymptomatic microscopic hematuria: Secondary | ICD-10-CM

## 2021-10-13 DIAGNOSIS — R252 Cramp and spasm: Secondary | ICD-10-CM

## 2021-10-13 LAB — BASIC METABOLIC PANEL
Anion gap: 7 (ref 5–15)
BUN: 19 mg/dL (ref 6–20)
CO2: 25 mmol/L (ref 22–32)
Calcium: 8.5 mg/dL — ABNORMAL LOW (ref 8.9–10.3)
Chloride: 105 mmol/L (ref 98–111)
Creatinine, Ser: 1.07 mg/dL — ABNORMAL HIGH (ref 0.44–1.00)
GFR, Estimated: 60 mL/min — ABNORMAL LOW (ref >60)
Glucose, Bld: 85 mg/dL (ref 70–99)
Potassium: 4.4 mmol/L (ref 3.5–5.1)
Sodium: 137 mmol/L (ref 135–145)

## 2021-10-13 LAB — POCT URINALYSIS DIPSTICK, ED / UC
Glucose, UA: NEGATIVE mg/dL
Leukocytes,Ua: NEGATIVE
Nitrite: NEGATIVE
Protein, ur: NEGATIVE mg/dL
Specific Gravity, Urine: 1.025 (ref 1.005–1.030)
Urobilinogen, UA: 1 mg/dL (ref 0.0–1.0)
pH: 5.5 (ref 5.0–8.0)

## 2021-10-13 LAB — CBG MONITORING, ED: Glucose-Capillary: 100 mg/dL — ABNORMAL HIGH (ref 70–99)

## 2021-10-13 MED ORDER — MELOXICAM 15 MG PO TABS: 15.0000 mg | ORAL_TABLET | Freq: Every day | ORAL | 0 refills | Status: DC

## 2021-10-13 MED ORDER — CYCLOBENZAPRINE HCL 10 MG PO TABS: ORAL_TABLET | ORAL | 0 refills | Status: AC

## 2021-10-13 NOTE — Discharge Instructions (Signed)
You have had labs (blood work and a urine culture) sent today. We will call you with any significant abnormalities or if there is need to begin or change treatment or pursue further follow up.  You may also review your test results online through MyChart. If you do not have a MyChart account, instructions to sign up should be on your discharge paperwork.  

## 2021-10-13 NOTE — ED Provider Notes (Signed)
[facility]   [id number] 10/13/21 Arrival Time: 1439  ASSESSMENT & PLAN:  1. Muscle cramps   2. Muscle cramps at night   3. Asymptomatic microscopic hematuria     Labs Reviewed  POCT URINALYSIS DIPSTICK, ED / UC - Abnormal; Notable for the following components:      Result Value   Bilirubin Urine SMALL (*)    Ketones, ur TRACE (*)    Hgb urine dipstick MODERATE (*)    All other components within normal limits  CBG MONITORING, ED - Abnormal; Notable for the following components:   Glucose-Capillary 100 (*)    All other components within normal limits  URINE CULTURE  BASIC METABOLIC PANEL  Urine culture and BMP pending.  Unclear cause of her muscle cramping/spasms. Trial of: Meds ordered this encounter  Medications   meloxicam (MOBIC) 15 MG tablet    Sig: Take 1 tablet (15 mg total) by mouth daily.    Dispense:  30 tablet    Refill:  0   cyclobenzaprine (FLEXERIL) 10 MG tablet    Sig: Take 1 tablet by mouth 3 times daily as needed for muscle spasm. Warning: May cause drowsiness.    Dispense:  21 tablet    Refill:  0    Follow-up Information     Placey, Chales Abrahams, NP.   Why: Keep your upcoming appointment. Need to recheck a urine sample again secondary to blood in your urine today. Contact information: [address] [place] [address] [phone]                Discussed importance of recheck urine at PCP f/u secondary to hematuria.  Reviewed expectations re: course of current medical issues. Questions answered. Outlined signs and symptoms indicating need for more acute intervention. Patient verbalized understanding. After Visit Summary given.   SUBJECTIVE:  Jody Spencer is a 61 y.o. female who has been without health insurance for quite awhile present with complaint of bilateral leg pains x 15 years; daily; keeps her up at night; difficult for her to describe. Ibuprofen with occasional help "but not helping as much now". No  injuries. No specific numbness/tingling of lower extremities. Normal ambulation. No balance issues. Also reports urinary frequency x 2-3 years; no dysuria; no hematuria; + nocturia several times per evening.  Has PCP appt in September.  Social History   Substance and Sexual Activity  Alcohol Use Yes   Comment: occasionally wine   Social History   Tobacco Use  Smoking Status Never  Smokeless Tobacco Never   Denies recreational drug use.  OBJECTIVE:  Vitals:   10/13/21 1500  BP: 123/85  Pulse: 80  Resp: 19  SpO2: 95%    General appearance: alert; no distress Eyes: PERRLA; EOMI; conjunctiva normal HENT: normocephalic; atraumatic; TMs normal; nasal mucosa normal; oral mucosa normal Neck: supple with FROM Lungs: clear to auscultation bilaterally Heart: regular rate and rhythm Abdomen: soft, non-tender; bowel sounds normal Extremities: no cyanosis or edema; symmetrical with no gross deformities Skin: warm and dry Neurologic: normal gait; DTR's normal and symmetric; CN 2-12 grossly intact Psychological: alert and cooperative; normal mood and affect   No Known Allergies  History reviewed. No pertinent past medical history. Social History   Socioeconomic History   Marital status: Married    Spouse name: Not on file   Number of children: Not on file   Years of education: Not on file   Highest education level: Not on file  Occupational History   Not  on file  Tobacco Use   Smoking status: Never   Smokeless tobacco: Never  Vaping Use   Vaping Use: Never used  Substance and Sexual Activity   Alcohol use: Yes    Comment: occasionally wine   Drug use: No   Sexual activity: Not on file  Other Topics Concern   Not on file  Social History Narrative   ** Merged History Encounter **       Social Determinants of Health   Financial Resource Strain: Not on file  Food Insecurity: Not on file  Transportation Needs: Not on file  Physical Activity: Not on file  Stress:  Not on file  Social Connections: Not on file  Intimate Partner Violence: Not on file   Family History  Problem Relation Age of Onset   Breast cancer Cousin    History reviewed. No pertinent surgical history.     Mardella Layman, MD 10/13/21 [phone]
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
## 2021-10-15 LAB — URINE CULTURE

## 2021-10-21 ENCOUNTER — Encounter (HOSPITAL_COMMUNITY): Payer: Self-pay | Admitting: Emergency Medicine

## 2021-10-21 ENCOUNTER — Emergency Department (HOSPITAL_COMMUNITY)
Admission: EM | Admit: 2021-10-21 | Discharge: 2021-10-21 | Disposition: A | Discharge status: Home / Self Care | Payer: Self-pay | Attending: Emergency Medicine | Admitting: Emergency Medicine

## 2021-10-21 ENCOUNTER — Emergency Department (HOSPITAL_COMMUNITY): Payer: Self-pay

## 2021-10-21 DIAGNOSIS — M79604 Pain in right leg: Secondary | ICD-10-CM | POA: Insufficient documentation

## 2021-10-21 DIAGNOSIS — M79605 Pain in left leg: Secondary | ICD-10-CM | POA: Insufficient documentation

## 2021-10-21 MED ORDER — ACETAMINOPHEN 500 MG PO TABS
1000.0000 mg | ORAL_TABLET | Freq: Once | ORAL | Status: AC
  Administered 2021-10-21: 1000 mg via ORAL
  Filled 2021-10-21: qty 2

## 2021-10-21 MED ORDER — IBUPROFEN 800 MG PO TABS
800.0000 mg | ORAL_TABLET | Freq: Once | ORAL | Status: AC
  Administered 2021-10-21: 800 mg via ORAL
  Filled 2021-10-21: qty 1

## 2021-10-21 NOTE — ED Notes (Signed)
Pt refused xray 

## 2021-10-21 NOTE — ED Triage Notes (Addendum)
Patient here with complaint of bilateral leg pain that started fifteen years ago. Patient denies any recent changes to leg pain. Patient is alert, oriented, ambulatory, and in no apparent distress at this time.

## 2021-10-21 NOTE — ED Provider Triage Note (Signed)
Emergency Medicine Provider Triage Evaluation Note  Jody Spencer , a 61 y.o. female  was evaluated in triage.  Pt complains of bilateral leg pain.  Patient states that symptoms began approximately 15 years ago with bilateral knee pain.  She also states she has had bilateral leg pain for the past 3 to 4 years starting at the hips and spreading distally past the knee.  Denies any recent trauma.  She states that she has fallen 2 times in the past 3 to 4 months but states she has been able to ambulate without difficulty since the incident.  Denies fever, chills, night sweats, chest pain, shortness of breath, Donnell pain, nausea, vomiting, urinary symptoms, change in bowel habits.  Denies saddle anesthesia, bowel/bladder dysfunction, weakness/sensory deficits in lower extremities, IV drug use history, known cancer diagnosis.  Review of Systems  Positive: See above Negative:   Physical Exam  BP 117/71 (BP Location: Right Arm)   Pulse 80   Temp 98.2 F (36.8 C) (Oral)   Resp 16   SpO2 98%  Gen:   Awake, no distress   Resp:  Normal effort  MSK:   Moves extremities without difficulty  Other:  Mild lumbar tenderness to palpation.  Patient has full range of motion of bilateral hips, knees, ankles.  No sensory deficits along major nerve distributions of lower extremities.  Strength symmetric and equal bilaterally.  Medical Decision Making  Medically screening exam initiated at 4:10 PM.  Appropriate orders placed.  Galina Haddox was informed that the remainder of the evaluation will be completed by another provider, this initial triage assessment does not replace that evaluation, and the importance of remaining in the ED until their evaluation is complete.     Peter Garter, [place] 10/21/21 1615

## 2021-10-21 NOTE — ED Notes (Signed)
Patient verbalizes understanding of discharge instructions. Opportunity for questioning and answers were provided. Armband removed by staff, pt discharged from ED.  

## 2021-10-21 NOTE — Discharge Instructions (Signed)
Take 4 over the counter ibuprofen tablets 3 times a day or 2 over-the-counter naproxen tablets twice a day for pain. Also take tylenol 1000mg (2 extra strength) four times a day.     Return for redness, fever.

## 2021-10-21 NOTE — ED Provider Notes (Signed)
[facility] EMERGENCY DEPARTMENT Provider Note   CSN: [id number] Arrival date & time: 10/21/21  1524     History  Chief Complaint  Patient presents with   Leg Pain    Jody Spencer is a 61 y.o. female.  61 yo F with a chief complaints of bilateral leg pain.  This been going on for at least 12 years.  She tells me that both of her legs ache from time to time.  Usually this is worse at night.  All over the legs at times but worse in the knees currently.  She denies trauma.  Denies back pain denies loss of bowel or bladder denies also partial sensation denies numbness or weakness to the legs.  She tells me that she seen multiple doctors and they have not been able to tell her what is wrong.  She would like x-rays today to see if they would help.   Leg Pain      Home Medications Prior to Admission medications   Medication Sig Start Date End Date Taking? Authorizing Provider  albuterol (PROVENTIL HFA;VENTOLIN HFA) 108 (90 Base) MCG/ACT inhaler Inhale 1-2 puffs into the lungs every 6 (six) hours as needed for wheezing or shortness of breath. 03/26/18   Wynetta Fines, MD  cyclobenzaprine (FLEXERIL) 10 MG tablet Take 1 tablet by mouth 3 times daily as needed for muscle spasm. Warning: May cause drowsiness. 10/13/21   Mardella Layman, MD  meloxicam (MOBIC) 15 MG tablet Take 1 tablet (15 mg total) by mouth daily. 10/13/21   Mardella Layman, MD      Allergies    Patient has no known allergies.    Review of Systems   Review of Systems  Physical Exam Updated Vital Signs BP (!) 147/88   Pulse 61   Temp (!) 97.5 F (36.4 C) (Oral)   Resp 18   SpO2 96%  Physical Exam Vitals and nursing note reviewed.  Constitutional:      General: She is not in acute distress.    Appearance: She is well-developed. She is not diaphoretic.  HENT:     Head: Normocephalic and atraumatic.  Eyes:     Pupils: Pupils are equal, round, and reactive to light.  Cardiovascular:     Rate  and Rhythm: Normal rate and regular rhythm.     Heart sounds: No murmur heard.    No friction rub. No gallop.  Pulmonary:     Effort: Pulmonary effort is normal.     Breath sounds: No wheezing or rales.  Abdominal:     General: There is no distension.     Palpations: Abdomen is soft.     Tenderness: There is no abdominal tenderness.  Musculoskeletal:        General: No tenderness.     Cervical back: Normal range of motion and neck supple.     Comments: Pulse motor and sensation intact bilateral lower extremities.  Reflexes are 2+ and equal.  Negative straight leg raise test bilaterally.  There is no ligamentous laxity appreciated to the knees.  No knee effusion.  No erythema no warmth.  No pain with range of motion of the hips.  Skin:    General: Skin is warm and dry.  Neurological:     Mental Status: She is alert and oriented to person, place, and time.  Psychiatric:        Behavior: Behavior normal.     ED Results / Procedures / Treatments   Labs (all  labs ordered are listed, but only abnormal results are displayed) Labs Reviewed - No data to display  EKG None  Radiology No results found.  Procedures Procedures    Medications Ordered in ED Medications  acetaminophen (TYLENOL) tablet 1,000 mg (1,000 mg Oral Given 10/21/21 1939)  ibuprofen (ADVIL) tablet 800 mg (800 mg Oral Given 10/21/21 1939)    ED Course/ Medical Decision Making/ A&P                           Medical Decision Making Risk OTC drugs. Prescription drug management.   60 yo F with a chief complaints of bilateral leg pain.  This been going on for at least 12 years.  No concerning findings on physical exam.  Do not feel that basic imaging would be helpful at this time.  I recommend that she follow-up with her family doctor. 10:58 PM:  I have discussed the diagnosis/risks/treatment options with the patient.  Evaluation and diagnostic testing in the emergency department does not suggest an emergent  condition requiring admission or immediate intervention beyond what has been performed at this time.  They will follow up with  PCP. We also discussed returning to the ED immediately if new or worsening sx occur. We discussed the sx which are most concerning (e.g., sudden worsening pain, fever, inability to tolerate by mouth) that necessitate immediate return. Medications administered to the patient during their visit and any new prescriptions provided to the patient are listed below.  Medications given during this visit Medications  acetaminophen (TYLENOL) tablet 1,000 mg (1,000 mg Oral Given 10/21/21 1939)  ibuprofen (ADVIL) tablet 800 mg (800 mg Oral Given 10/21/21 1939)     The patient appears reasonably screen and/or stabilized for discharge and I doubt any other medical condition or other [facility] requiring further screening, evaluation, or treatment in the ED at this time prior to discharge.           Final Clinical Impression(s) / ED Diagnoses Final diagnoses:  Bilateral leg pain    Rx / DC Orders ED Discharge Orders     None         Melene Plan, DO 10/21/21 2258

## 2021-12-15 ENCOUNTER — Other Ambulatory Visit: Payer: Self-pay | Admitting: Family Medicine

## 2021-12-15 DIAGNOSIS — F418 Other specified anxiety disorders: Secondary | ICD-10-CM | POA: Insufficient documentation

## 2021-12-15 DIAGNOSIS — E669 Obesity, unspecified: Secondary | ICD-10-CM | POA: Insufficient documentation

## 2021-12-15 DIAGNOSIS — M1990 Unspecified osteoarthritis, unspecified site: Secondary | ICD-10-CM | POA: Insufficient documentation

## 2021-12-15 DIAGNOSIS — G8929 Other chronic pain: Secondary | ICD-10-CM | POA: Insufficient documentation

## 2021-12-15 DIAGNOSIS — Z1231 Encounter for screening mammogram for malignant neoplasm of breast: Secondary | ICD-10-CM

## 2021-12-28 ENCOUNTER — Ambulatory Visit: Payer: Commercial Managed Care - HMO | Attending: Family Medicine | Admitting: Physical Therapy

## 2021-12-28 DIAGNOSIS — M6281 Muscle weakness (generalized): Secondary | ICD-10-CM | POA: Insufficient documentation

## 2021-12-28 DIAGNOSIS — M25562 Pain in left knee: Secondary | ICD-10-CM | POA: Diagnosis present

## 2021-12-28 DIAGNOSIS — R600 Localized edema: Secondary | ICD-10-CM | POA: Diagnosis present

## 2021-12-28 DIAGNOSIS — R2681 Unsteadiness on feet: Secondary | ICD-10-CM | POA: Diagnosis present

## 2021-12-28 DIAGNOSIS — R262 Difficulty in walking, not elsewhere classified: Secondary | ICD-10-CM | POA: Insufficient documentation

## 2021-12-28 DIAGNOSIS — G8929 Other chronic pain: Secondary | ICD-10-CM | POA: Diagnosis present

## 2021-12-28 DIAGNOSIS — R278 Other lack of coordination: Secondary | ICD-10-CM | POA: Diagnosis present

## 2021-12-28 DIAGNOSIS — M25561 Pain in right knee: Secondary | ICD-10-CM | POA: Insufficient documentation

## 2021-12-28 NOTE — Therapy (Signed)
OUTPATIENT PHYSICAL THERAPY THORACOLUMBAR EVALUATION   Patient Name: Jody Spencer MRN: [id number] DOB:May 05, 1960, 61 y.o., female Today's Date: 12/28/2021   PT End of Session - 12/28/21 1911     Visit Number 1    Date for PT Re-Evaluation 03/22/22    PT Start Time 1502    PT Stop Time 1540    PT Time Calculation (min) 38 min    Activity Tolerance Patient limited by pain    Behavior During Therapy [facility] for tasks assessed/performed             No past medical history on file. No past surgical history on file. There are no problems to display for this patient.   PCP: Jody Spencer  REFERRING PROVIDER: Blair Dolphin, FNP  REFERRING DIAG: Diagnosis G89.29 (ICD-10-CM) - Other chronic pain  Rationale for Evaluation and Treatment: Rehabilitation  THERAPY DIAG:  Difficulty in walking, not elsewhere classified - Plan: PT plan of care cert/re-cert  Localized edema - Plan: PT plan of care cert/re-cert  Muscle weakness (generalized) - Plan: PT plan of care cert/re-cert  Other lack of coordination - Plan: PT plan of care cert/re-cert  Unsteadiness on feet - Plan: PT plan of care cert/re-cert  Bilateral chronic knee pain - Plan: PT plan of care cert/re-cert  ONSET DATE: [id number]  SUBJECTIVE:                                                                                                                                                                                           SUBJECTIVE STATEMENT: Patient reports that she was diagnosed with B knee arthritis in 2008. Now her legs hurt almost all the time, the knees are the worst, but her entire legs have pain. She is also starting to have pain in B fingers, unable to carry any heavy items. She has been going to a pro bono PT clinic as she did not have any insurance. They were giving her some exercises for strength.   PERTINENT HISTORY:  61 yo F with a chief complaints of bilateral leg pain. This been going on for at  least 12 years. She tells me that both of her legs ache from time to time. Usually this is worse at night. All over the legs at times but worse in the knees currently. She denies trauma. Denies back pain denies loss of bowel or bladder denies also partial sensation denies numbness or weakness to the legs. She tells me that she seen multiple doctors and they have not been able to tell her what is wrong. She would like x-rays today to see if they would help.  PAIN:  Are you having pain? Yes: NPRS scale: 10/10 Pain location: knees are worst, but entire legs hurt Pain description: constant, can be sharp Aggravating factors: Stair climbing Relieving factors: Ibuprofen helps, but causes nausea  PRECAUTIONS: None  WEIGHT BEARING RESTRICTIONS: No  FALLS:  Has patient fallen in last 6 months? Yes. Number of falls 2 she thinks her knees buckled going up the steps.  LIVING ENVIRONMENT: Lives with: lives with their family Lives in: House/apartment Stairs: Yes: External: 4 steps; bilateral but cannot reach both Has following equipment at home: Single point cane  OCCUPATION: N/A  PLOF: Independent  PATIENT GOALS: Patient would like to identify any exercises she can perform at home to help control the pain.  NEXT MD VISIT: N/A  OBJECTIVE:   DIAGNOSTIC FINDINGS:  N/A SWELLING:  B knees with edema distal to patella  SCREENING FOR RED FLAGS: Bowel or bladder incontinence: No Spinal tumors: No Cauda equina syndrome: No Compression fracture: No Abdominal aneurysm: No  COGNITION: Overall cognitive status: Within functional limits for tasks assessed     SENSATION: Light touch: Impaired   MUSCLE LENGTH: Hamstrings: Right 70 deg; Left 60 deg Thomas test: WFL  POSTURE: rounded shoulders and forward head B genu valgum at knees  PALPATION: B knees TTP on medial/posterior borders, patellar tendon. B patella appear to ride slightly high  LOWER EXTREMITY ROM:   BLE ROM moderately limited  in hips and knees due to knee pain    LOWER EXTREMITY MMT:  All strength limited due to pain  MMT Right eval Left eval  Hip flexion 3+ 3+  Hip extension 3- 3-  Hip abduction    Hip adduction    Hip internal rotation 3_+ 3+  Hip external rotation 3+ 3+  Knee flexion 3+ 3+  Knee extension 3+ 3+  Ankle dorsiflexion 4 4  Ankle plantarflexion    Ankle inversion    Ankle eversion     (Blank rows = not tested)  KNEE SPECIAL TESTS:  Patellar grind test (+) L  FUNCTIONAL TESTS:  5 times sit to stand: 24.69 Timed up and go (TUG): 20.57 Functional gait assessment: TBD  GAIT: Distance walked: 78' Assistive device utilized: Single point cane Level of assistance: Modified independence Comments: Patient reports falls, gait is antalgic, step to, unsteady  TODAY'S TREATMENT:                                                                                                                              DATE:  12/28/21  Education   PATIENT EDUCATION:  Education details: POC Person educated: Patient Education method: Explanation Education comprehension: verbalized understanding  HOME EXERCISE PROGRAM: TBD  ASSESSMENT:  CLINICAL IMPRESSION: Patient is a 61 y.o. who was seen today for physical therapy evaluation and treatment for chronic B knee pain. She has not been followed by a physician in the past due to lack of insurance, but has started with a new health care provider who performed  multiple blood tests. She is to return to see him next week. She also notes fairly new onset of hand stiffness and weakness and nodules forming on her fingers. She was told many years ago that she has arthritis in her knees. B knees with genu valgum, mild. She has some swelling in B knees, pouches distal to her patella, as well as TTP around B knees and pain with patellar grind test on L. She reports multiple falls. Patient is very weak and will benefit from PT to address overall weakness and pain in her  knees. I am glad that she is also being followed by a healthcare provider as she may have some other medical issues which are contributing to her decline.  OBJECTIVE IMPAIRMENTS: Abnormal gait, decreased activity tolerance, decreased balance, decreased coordination, decreased endurance, decreased mobility, difficulty walking, decreased ROM, decreased strength, increased muscle spasms, impaired flexibility, impaired sensation, improper body mechanics, postural dysfunction, and pain.   ACTIVITY LIMITATIONS: carrying, lifting, bending, sitting, standing, squatting, sleeping, stairs, transfers, bed mobility, and locomotion level  PARTICIPATION LIMITATIONS: meal prep, cleaning, laundry, driving, shopping, and community activity  PERSONAL FACTORS: Past/current experiences are also affecting patient's functional outcome.   REHAB POTENTIAL: Good  CLINICAL DECISION MAKING: Evolving/moderate complexity  EVALUATION COMPLEXITY: High   GOALS: Goals reviewed with patient? Yes  SHORT TERM GOALS: Target date: 01/25/2022  I with basic HEP Baseline: Goal status: INITIAL  LONG TERM GOALS: Target date: 03/22/2022  I with final HEP Baseline:  Goal status: INITIAL  2.  Improve Tug to < 12 sec Baseline: 20.5 sec Goal status: INITIAL  3.  Improve 5 x STS to < 12 sec Baseline: 24.7 sec Goal status: INITIAL  4.  Improve BLE strength to at least 4/5 Baseline: 3 Goal status: INITIAL  5.  Patient will be able to walk at least 500' on level and unlevel surfaces with LRAD, MI, pain < 3/10 Baseline: 80', unsteady on level surfaces and painful Goal status: INITIAL  6.  Patient will be able to perform her normal daily activities with knee pain < 3/10 Baseline: 9/10 Goal status: INITIAL  PLAN:  PT FREQUENCY: 1x/week  PT DURATION: 12 weeks  PLANNED INTERVENTIONS: Therapeutic exercises, Therapeutic activity, Neuromuscular re-education, Balance training, Gait training, Patient/Family education,  Self Care, Joint mobilization, Stair training, Dry Needling, Electrical stimulation, Cryotherapy, Moist heat, Vasopneumatic device, Ionotophoresis 4mg /ml Dexamethasone, and Manual therapy.  PLAN FOR NEXT SESSION: HEP, acute pain management.   Marcelina Morel, DPT 12/28/2021, 7:18 PM

## 2022-01-09 ENCOUNTER — Encounter: Payer: Self-pay | Admitting: Physical Therapy

## 2022-01-09 ENCOUNTER — Ambulatory Visit: Payer: Commercial Managed Care - HMO | Admitting: Physical Therapy

## 2022-01-09 DIAGNOSIS — R600 Localized edema: Secondary | ICD-10-CM

## 2022-01-09 DIAGNOSIS — R262 Difficulty in walking, not elsewhere classified: Secondary | ICD-10-CM | POA: Diagnosis not present

## 2022-01-09 DIAGNOSIS — R2681 Unsteadiness on feet: Secondary | ICD-10-CM

## 2022-01-09 DIAGNOSIS — G8929 Other chronic pain: Secondary | ICD-10-CM

## 2022-01-09 DIAGNOSIS — M6281 Muscle weakness (generalized): Secondary | ICD-10-CM

## 2022-01-09 DIAGNOSIS — R278 Other lack of coordination: Secondary | ICD-10-CM

## 2022-01-09 NOTE — Therapy (Signed)
OUTPATIENT PHYSICAL THERAPY THORACOLUMBAR EVALUATION   Patient Name: Jody Spencer MRN: [id number] DOB:1960/03/23, 61 y.o., female Today's Date: 01/09/2022   PT End of Session - 01/09/22 1552     Visit Number 2    Date for PT Re-Evaluation 03/22/22    PT Start Time 1544    PT Stop Time 1625    PT Time Calculation (min) 41 min    Activity Tolerance Patient limited by pain    Behavior During Therapy [facility] for tasks assessed/performed              No past medical history on file. No past surgical history on file. There are no problems to display for this patient.   PCP: Lavinia Sharps  REFERRING PROVIDER: Tiana Loft, FNP  REFERRING DIAG: Diagnosis G89.29 (ICD-10-CM) - Other chronic pain  Rationale for Evaluation and Treatment: Rehabilitation  THERAPY DIAG:  Difficulty in walking, not elsewhere classified - Plan: PT plan of care cert/re-cert  Localized edema - Plan: PT plan of care cert/re-cert  Muscle weakness (generalized) - Plan: PT plan of care cert/re-cert  Other lack of coordination - Plan: PT plan of care cert/re-cert  Unsteadiness on feet - Plan: PT plan of care cert/re-cert  Bilateral chronic knee pain - Plan: PT plan of care cert/re-cert  ONSET DATE: 12/15/2021  SUBJECTIVE:                                                                                                                                                                                           SUBJECTIVE STATEMENT: Patient reports that she woke up with increased back pain and  B knee stiffness. Having a lot of difficulty moving.  PERTINENT HISTORY:  Jody Spencer with a chief complaints of bilateral leg pain. This been going on for at least 12 years. She tells me that both of her legs ache from time to time. Usually this is worse at night. All over the legs at times but worse in the knees currently. She denies trauma. Denies back pain denies loss of bowel or bladder denies also partial  sensation denies numbness or weakness to the legs. She tells me that she seen multiple doctors and they have not been able to tell her what is wrong. She would like x-rays today to see if they would help.  PAIN:  Are you having pain? Yes: NPRS scale: 10/10 Pain location: knees are worst, but entire legs hurt Pain description: constant, can be sharp Aggravating factors: Stair climbing Relieving factors: Ibuprofen helps, but causes nausea  PRECAUTIONS: None  WEIGHT BEARING RESTRICTIONS: No  FALLS:  Has patient fallen in last  6 months? Yes. Number of falls 2 she thinks her knees buckled going up the steps.  LIVING ENVIRONMENT: Lives with: lives with their family Lives in: House/apartment Stairs: Yes: External: 4 steps; bilateral but cannot reach both Has following equipment at home: Single point cane  OCCUPATION: N/A  PLOF: Independent  PATIENT GOALS: Patient would like to identify any exercises she can perform at home to help control the pain.  NEXT MD VISIT: N/A  OBJECTIVE:   DIAGNOSTIC FINDINGS:  N/A SWELLING:  B knees with edema distal to patella  SCREENING FOR RED FLAGS: Bowel or bladder incontinence: No Spinal tumors: No Cauda equina syndrome: No Compression fracture: No Abdominal aneurysm: No  COGNITION: Overall cognitive status: Within functional limits for tasks assessed     SENSATION: Light touch: Impaired   MUSCLE LENGTH: Hamstrings: Right 70 deg; Left 60 deg Thomas test: WFL  POSTURE: rounded shoulders and forward head B genu valgum at knees  PALPATION: B knees TTP on medial/posterior borders, patellar tendon. B patella appear to ride slightly high  LOWER EXTREMITY ROM:   BLE ROM moderately limited in hips and knees due to knee pain    LOWER EXTREMITY MMT:  All strength limited due to pain  MMT Right eval Left eval  Hip flexion 3+ 3+  Hip extension 3- 3-  Hip abduction    Hip adduction    Hip internal rotation 3_+ 3+  Hip external  rotation 3+ 3+  Knee flexion 3+ 3+  Knee extension 3+ 3+  Ankle dorsiflexion 4 4  Ankle plantarflexion    Ankle inversion    Ankle eversion     (Blank rows = not tested)  KNEE SPECIAL TESTS:  Patellar grind test (+) L  FUNCTIONAL TESTS:  5 times sit to stand: 24.Jody Timed up and go (TUG): 20.57 Functional gait assessment: TBD  GAIT: Distance walked: 50' Assistive device utilized: Single point cane Level of assistance: Modified independence Comments: Patient reports falls, gait is antalgic, step to, unsteady  TODAY'S TREATMENT:                                                                                                                              DATE:  01/09/22 NuStep L4 with arms and legs. Minimal leg motion achieved, off and on for 5 minutes. Supine SAQ x 10 each leg. Supine pelvic tilt x 10 reps Lower trunk rotation, very limited to each side, 10 reps each way. Supine march Supine heel walk outs Seated long arc quad x 5 with each legs Seated knee flexion, 10 reps each, AROM   12/28/21  Education   PATIENT EDUCATION:  Education details: POC Person educated: Patient Education method: Explanation Education comprehension: verbalized understanding  HOME EXERCISE PROGRAM:  [id number]  ASSESSMENT:  CLINICAL IMPRESSION: Patient arrives moving very slowly, with noticeable antalgic gait pattern. Therapist attempted to help her mobilize very gently. Almost all movement was painful, but she did get through the exercises, which  were added to an HEP. She is requesting exercises for her hands as well. Recommend OT consult, will contact physician to request. She did move slightly better after treatment, slightly quicker and appears less painful.  OBJECTIVE IMPAIRMENTS: Abnormal gait, decreased activity tolerance, decreased balance, decreased coordination, decreased endurance, decreased mobility, difficulty walking, decreased ROM, decreased strength, increased muscle spasms,  impaired flexibility, impaired sensation, improper body mechanics, postural dysfunction, and pain.   ACTIVITY LIMITATIONS: carrying, lifting, bending, sitting, standing, squatting, sleeping, stairs, transfers, bed mobility, and locomotion level  PARTICIPATION LIMITATIONS: meal prep, cleaning, laundry, driving, shopping, and community activity  PERSONAL FACTORS: Past/current experiences are also affecting patient's functional outcome.   REHAB POTENTIAL: Good  CLINICAL DECISION MAKING: Evolving/moderate complexity  EVALUATION COMPLEXITY: High   GOALS: Goals reviewed with patient? Yes  SHORT TERM GOALS: Target date: 01/25/2022  I with basic HEP Baseline: Goal status: ongoing  LONG TERM GOALS: Target date: 03/22/2022  I with final HEP Baseline:  Goal status: INITIAL  2.  Improve Tug to < 12 sec Baseline: 20.5 sec Goal status: INITIAL  3.  Improve 5 x STS to < 12 sec Baseline: 24.7 sec Goal status: INITIAL  4.  Improve BLE strength to at least 4/5 Baseline: 3 Goal status: INITIAL  5.  Patient will be able to walk at least 500' on level and unlevel surfaces with LRAD, MI, pain < 3/10 Baseline: 80', unsteady on level surfaces and painful Goal status: INITIAL  6.  Patient will be able to perform her normal daily activities with knee pain < 3/10 Baseline: 9/10 Goal status: INITIAL  PLAN:  PT FREQUENCY: 1x/week  PT DURATION: 12 weeks  PLANNED INTERVENTIONS: Therapeutic exercises, Therapeutic activity, Neuromuscular re-education, Balance training, Gait training, Patient/Family education, Self Care, Joint mobilization, Stair training, Dry Needling, Electrical stimulation, Cryotherapy, Moist heat, Vasopneumatic device, Ionotophoresis 4mg /ml Dexamethasone, and Manual therapy.  PLAN FOR NEXT SESSION: HEP, acute pain management. OT consult?   , DPT 12/28/2021, 4:28 PM

## 2022-01-13 LAB — LAB REPORT - SCANNED
EGFR: 104
HM Hepatitis Screen: NEGATIVE

## 2022-01-18 ENCOUNTER — Ambulatory Visit: Payer: Commercial Managed Care - HMO | Attending: Family Medicine | Admitting: Physical Therapy

## 2022-01-18 ENCOUNTER — Encounter: Payer: Self-pay | Admitting: Physical Therapy

## 2022-01-18 DIAGNOSIS — R278 Other lack of coordination: Secondary | ICD-10-CM | POA: Insufficient documentation

## 2022-01-18 DIAGNOSIS — R262 Difficulty in walking, not elsewhere classified: Secondary | ICD-10-CM | POA: Diagnosis present

## 2022-01-18 DIAGNOSIS — M25561 Pain in right knee: Secondary | ICD-10-CM | POA: Insufficient documentation

## 2022-01-18 DIAGNOSIS — G8929 Other chronic pain: Secondary | ICD-10-CM | POA: Insufficient documentation

## 2022-01-18 DIAGNOSIS — M25562 Pain in left knee: Secondary | ICD-10-CM | POA: Insufficient documentation

## 2022-01-18 DIAGNOSIS — R2681 Unsteadiness on feet: Secondary | ICD-10-CM | POA: Diagnosis present

## 2022-01-18 DIAGNOSIS — R600 Localized edema: Secondary | ICD-10-CM | POA: Diagnosis present

## 2022-01-18 DIAGNOSIS — M6281 Muscle weakness (generalized): Secondary | ICD-10-CM | POA: Insufficient documentation

## 2022-01-18 NOTE — Therapy (Signed)
OUTPATIENT PHYSICAL THERAPY THORACOLUMBAR EVALUATION   Patient Name: Jody Spencer MRN: [id number] DOB:[birth year], 61 y.o., female Today's Date: 01/18/2022   PT End of Session - 01/18/22 1557     Visit Number 3    Date for PT Re-Evaluation 03/22/22    PT Start Time 1551    PT Stop Time 1625    PT Time Calculation (min) 34 min    Activity Tolerance Patient limited by pain    Behavior During Therapy [facility] for tasks assessed/performed               No past medical history on file. No past surgical history on file. There are no problems to display for this patient.   PCP: Lavinia Sharps  REFERRING PROVIDER: Tiana Loft, FNP  REFERRING DIAG: Diagnosis G89.29 (ICD-10-CM) - Other chronic pain  Rationale for Evaluation and Treatment: Rehabilitation  THERAPY DIAG:  Difficulty in walking, not elsewhere classified - Plan: PT plan of care cert/re-cert  Localized edema - Plan: PT plan of care cert/re-cert  Muscle weakness (generalized) - Plan: PT plan of care cert/re-cert  Other lack of coordination - Plan: PT plan of care cert/re-cert  Unsteadiness on feet - Plan: PT plan of care cert/re-cert  Bilateral chronic knee pain - Plan: PT plan of care cert/re-cert  ONSET DATE: 12/15/2021  SUBJECTIVE:                                                                                                                                                                                           SUBJECTIVE STATEMENT: Patient reports that she woke up with increased back pain and  B knee stiffness. Having a lot of difficulty moving.  PERTINENT HISTORY:  61 yo F with a chief complaints of bilateral leg pain. This been going on for at least 12 years. She tells me that both of her legs ache from time to time. Usually this is worse at night. All over the legs at times but worse in the knees currently. She denies trauma. Denies back pain denies loss of bowel or bladder denies also partial  sensation denies numbness or weakness to the legs. She tells me that she seen multiple doctors and they have not been able to tell her what is wrong. She would like x-rays today to see if they would help.  PAIN:  Are you having pain? Yes: NPRS scale: 10/10 Pain location: knees are worst, but entire legs hurt Pain description: constant, can be sharp Aggravating factors: Stair climbing Relieving factors: Ibuprofen helps, but causes nausea  PRECAUTIONS: None  WEIGHT BEARING RESTRICTIONS: No  FALLS:  Has patient fallen in  last 6 months? Yes. Number of falls 2 she thinks her knees buckled going up the steps.  LIVING ENVIRONMENT: Lives with: lives with their family Lives in: House/apartment Stairs: Yes: External: 4 steps; bilateral but cannot reach both Has following equipment at home: Single point cane  OCCUPATION: N/A  PLOF: Independent  PATIENT GOALS: Patient would like to identify any exercises she can perform at home to help control the pain.  NEXT MD VISIT: N/A  OBJECTIVE:   DIAGNOSTIC FINDINGS:  N/A SWELLING:  B knees with edema distal to patella  SCREENING FOR RED FLAGS: Bowel or bladder incontinence: No Spinal tumors: No Cauda equina syndrome: No Compression fracture: No Abdominal aneurysm: No  COGNITION: Overall cognitive status: Within functional limits for tasks assessed     SENSATION: Light touch: Impaired   MUSCLE LENGTH: Hamstrings: Right 70 deg; Left 60 deg Thomas test: WFL  POSTURE: rounded shoulders and forward head B genu valgum at knees  PALPATION: B knees TTP on medial/posterior borders, patellar tendon. B patella appear to ride slightly high  LOWER EXTREMITY ROM:   BLE ROM moderately limited in hips and knees due to knee pain    LOWER EXTREMITY MMT:  All strength limited due to pain  MMT Right eval Left eval  Hip flexion 3+ 3+  Hip extension 3- 3-  Hip abduction    Hip adduction    Hip internal rotation 3_+ 3+  Hip external  rotation 3+ 3+  Knee flexion 3+ 3+  Knee extension 3+ 3+  Ankle dorsiflexion 4 4  Ankle plantarflexion    Ankle inversion    Ankle eversion     (Blank rows = not tested)  KNEE SPECIAL TESTS:  Patellar grind test (+) L  FUNCTIONAL TESTS:  5 times sit to stand: 24.69 Timed up and go (TUG): 20.57 Functional gait assessment: TBD  GAIT: Distance walked: 55' Assistive device utilized: Single point cane Level of assistance: Modified independence Comments: Patient reports falls, gait is antalgic, step to, unsteady  TODAY'S TREATMENT:                                                                                                                              DATE:  01/18/22 NuStep L3 x 5 minutes with BUE and LE, very small motion Supine LE strengthening exercises-HS, quad sets, SAQ, bridging over bolster, clamshells against G tband at knees MH to B knees x 10 minutes  01/09/22 NuStep L4 with arms and legs. Minimal leg motion achieved, off and on for 5 minutes. Supine SAQ x 10 each leg. Supine pelvic tilt x 10 reps Lower trunk rotation, very limited to each side, 10 reps each way. Supine march Supine heel walk outs Seated long arc quad x 5 with each legs Seated knee flexion, 10 reps each, AROM   12/28/21  Education   PATIENT EDUCATION:  Education details: POC Person educated: Patient Education method: Explanation Education comprehension: verbalized understanding  HOME EXERCISE PROGRAM:  [email or web address]  ASSESSMENT:  CLINICAL IMPRESSION: Patient arrives stating her knee pain has been very severe. She had Xrays the other day and was told she has arthritis, but has not been back to the Rheumatologist to confirm or to determine next steps. She performed some basic knee strengthening exercises, attempting to include isometrics and small movement to avoid knee pain. She was able to perform, but was limited due to pain. Discussed options moving forward. She tolerates very limited  treatment activities, but also educated her that the stronger she can get her muscles, the better she will be prepared for whatever lies ahead. Called her Dr office requesting an OT consult for her hands. She reports she did receive a referral, but is not sure where and she has not received a call from them to schedule anything.  OBJECTIVE IMPAIRMENTS: Abnormal gait, decreased activity tolerance, decreased balance, decreased coordination, decreased endurance, decreased mobility, difficulty walking, decreased ROM, decreased strength, increased muscle spasms, impaired flexibility, impaired sensation, improper body mechanics, postural dysfunction, and pain.   ACTIVITY LIMITATIONS: carrying, lifting, bending, sitting, standing, squatting, sleeping, stairs, transfers, bed mobility, and locomotion level  PARTICIPATION LIMITATIONS: meal prep, cleaning, laundry, driving, shopping, and community activity  PERSONAL FACTORS: Past/current experiences are also affecting patient's functional outcome.   REHAB POTENTIAL: Good  CLINICAL DECISION MAKING: Evolving/moderate complexity  EVALUATION COMPLEXITY: High   GOALS: Goals reviewed with patient? Yes  SHORT TERM GOALS: Target date: 01/25/2022  I with basic HEP Baseline: Goal status: ongoing  LONG TERM GOALS: Target date: 03/22/2022  I with final HEP Baseline:  Goal status: INITIAL  2.  Improve Tug to < 12 sec Baseline: 20.5 sec Goal status: INITIAL  3.  Improve 5 x STS to < 12 sec Baseline: 24.7 sec Goal status: INITIAL  4.  Improve BLE strength to at least 4/5 Baseline: 3 Goal status: ongoing  5.  Patient will be able to walk at least 500' on level and unlevel surfaces with LRAD, MI, pain < 3/10 Baseline: 80', unsteady on level surfaces and painful Goal status: ongoing  6.  Patient will be able to perform her normal daily activities with knee pain < 3/10 Baseline: 9/10 Goal status: INITIAL  PLAN:  PT FREQUENCY: 1x/week  PT  DURATION: 12 weeks  PLANNED INTERVENTIONS: Therapeutic exercises, Therapeutic activity, Neuromuscular re-education, Balance training, Gait training, Patient/Family education, Self Care, Joint mobilization, Stair training, Dry Needling, Electrical stimulation, Cryotherapy, Moist heat, Vasopneumatic device, Ionotophoresis 4mg /ml Dexamethasone, and Manual therapy.  PLAN FOR NEXT SESSION: HEP, acute pain management. Cont or hold PT?   , DPT 12/28/2021, 4:27 PM

## 2022-01-24 NOTE — Therapy (Signed)
OUTPATIENT PHYSICAL THERAPY TREATMENT   Patient Name: Jody Spencer MRN: [id number] DOB:1960/12/19, 61 y.o., female Today's Date: 01/25/2022   PT End of Session - 01/25/22 1152     Visit Number 4    Date for PT Re-Evaluation 03/22/22    PT Start Time 1146    PT Stop Time 1229    PT Time Calculation (min) 43 min    Activity Tolerance Patient limited by pain    Behavior During Therapy [facility] for tasks assessed/performed                No past medical history on file. No past surgical history on file. There are no problems to display for this patient.   PCP: Lavinia Sharps  REFERRING PROVIDER: Tiana Loft, FNP  REFERRING DIAG: Diagnosis G89.29 (ICD-10-CM) - Other chronic pain  Rationale for Evaluation and Treatment: Rehabilitation  THERAPY DIAG:  Difficulty in walking, not elsewhere classified - Plan: PT plan of care cert/re-cert  Localized edema - Plan: PT plan of care cert/re-cert  Muscle weakness (generalized) - Plan: PT plan of care cert/re-cert  Other lack of coordination - Plan: PT plan of care cert/re-cert  Unsteadiness on feet - Plan: PT plan of care cert/re-cert  Bilateral chronic knee pain - Plan: PT plan of care cert/re-cert  ONSET DATE: 12/15/2021  SUBJECTIVE:                                                                                                                                                                                           SUBJECTIVE STATEMENT: Pt states that today isn't as bad. She tried some of her HEP this morning before coming.   PERTINENT HISTORY:  61 yo F with a chief complaints of bilateral leg pain. This been going on for at least 12 years. She tells me that both of her legs ache from time to time. Usually this is worse at night. All over the legs at times but worse in the knees currently. She denies trauma. Denies back pain denies loss of bowel or bladder denies also partial sensation denies numbness or weakness  to the legs. She tells me that she seen multiple doctors and they have not been able to tell her what is wrong. She would like x-rays today to see if they would help.  PAIN:  Are you having pain? Yes: NPRS scale: 7/10 Pain location: knees  Pain description: constant, can be sharp Aggravating factors: Stair climbing Relieving factors: Ibuprofen helps, but causes nausea  PRECAUTIONS: None  WEIGHT BEARING RESTRICTIONS: No  FALLS:  Has patient fallen in last 6 months? Yes. Number of falls 2  she thinks her knees buckled going up the steps.  LIVING ENVIRONMENT: Lives with: lives with their family Lives in: House/apartment Stairs: Yes: External: 4 steps; bilateral but cannot reach both Has following equipment at home: Single point cane  OCCUPATION: N/A  PLOF: Independent  PATIENT GOALS: Patient would like to identify any exercises she can perform at home to help control the pain.  NEXT MD VISIT: N/A  OBJECTIVE:   DIAGNOSTIC FINDINGS:  N/A SWELLING:  B knees with edema distal to patella  SCREENING FOR RED FLAGS: Bowel or bladder incontinence: No Spinal tumors: No Cauda equina syndrome: No Compression fracture: No Abdominal aneurysm: No  COGNITION: Overall cognitive status: Within functional limits for tasks assessed     SENSATION: Light touch: Impaired   MUSCLE LENGTH: Hamstrings: Right 70 deg; Left 60 deg Thomas test: WFL  POSTURE: rounded shoulders and forward head B genu valgum at knees  PALPATION: B knees TTP on medial/posterior borders, patellar tendon. B patella appear to ride slightly high  LOWER EXTREMITY ROM:   BLE ROM moderately limited in hips and knees due to knee pain    LOWER EXTREMITY MMT:  All strength limited due to pain  MMT Right eval Left eval  Hip flexion 3+ 3+  Hip extension 3- 3-  Hip abduction    Hip adduction    Hip internal rotation 3_+ 3+  Hip external rotation 3+ 3+  Knee flexion 3+ 3+  Knee extension 3+ 3+  Ankle  dorsiflexion 4 4  Ankle plantarflexion    Ankle inversion    Ankle eversion     (Blank rows = not tested)  KNEE SPECIAL TESTS:  Patellar grind test (+) L  FUNCTIONAL TESTS:  5 times sit to stand: 24.69 Timed up and go (TUG): 20.57 Functional gait assessment: TBD  GAIT: Distance walked: 39' Assistive device utilized: Single point cane Level of assistance: Modified independence Comments: Patient reports falls, gait is antalgic, step to, unsteady  TODAY'S TREATMENT:                                                                                                                              DATE:  01/25/22 Nustep x5 min Supine SAQ x10 reps  Hamstring iso in therapy ball 10x5 sec Seated quad iso at 100 deg flexion x10 reps  MH to B knees x 5 minutes- PT discussing HEP components and answering pt questions  Manual tx McConnell tape pulling Lt patella laterally. Pt reporting this improved pain with knee flexion   01/18/22 NuStep L3 x 5 minutes with BUE and LE, very small motion Supine LE strengthening exercises-HS, quad sets, SAQ, bridging over bolster, clamshells against G tband at knees MH to B knees x 10 minutes  01/09/22 NuStep L4 with arms and legs. Minimal leg motion achieved, off and on for 5 minutes. Supine SAQ x 10 each leg. Supine pelvic tilt x 10 reps Lower trunk rotation, very limited to each side, 10  reps each way. Supine march Supine heel walk outs Seated long arc quad x 5 with each legs Seated knee flexion, 10 reps each, AROM   12/28/21  Education   PATIENT EDUCATION:  Education details: POC Person educated: Patient Education method: Explanation Education comprehension: verbalized understanding  HOME EXERCISE PROGRAM: Access Code: [email or web address] URL: [email or web address] Date: 01/25/2022 Prepared by: [facility]  Exercises - Hooklying Heel Walk  - 1 x daily - 7 x weekly - 3 reps - Seated  Long Arc Quad  - 1 x daily - 7 x weekly - 10 reps - Seated Knee Flexion Slide  - 1 x daily - 7 x weekly - 10 reps - Seated Heel Toe Raises  - 1 x daily - 7 x weekly - 5 reps - Supine Heel Slide  - 1 x daily - 7 x weekly - 1 sets - 10 reps - Supine Short Arc Quad  - 1 x daily - 7 x weekly - 1 sets - 10 reps - Bridge  - 1 x daily - 7 x weekly - 1 sets - 10 reps - Hooklying Clamshell with Resistance  - 1 x daily - 7 x weekly - 1 sets - 10 reps  ASSESSMENT:  CLINICAL IMPRESSION: Pt's pain is improved from last visit. She reports attempting HEP this morning without any issues. Session continued with focus on increasing knee ROM and strength. Pt reported pain with most exercises, primarily involving knee flexion. She demonstrates excessive knee valgus deviation on the Lt and was educated on ways to improve this with HEP and throughout the day. Pt reported pain improvement with ambulation and knee flexion following tape application.   OBJECTIVE IMPAIRMENTS: Abnormal gait, decreased activity tolerance, decreased balance, decreased coordination, decreased endurance, decreased mobility, difficulty walking, decreased ROM, decreased strength, increased muscle spasms, impaired flexibility, impaired sensation, improper body mechanics, postural dysfunction, and pain.   ACTIVITY LIMITATIONS: carrying, lifting, bending, sitting, standing, squatting, sleeping, stairs, transfers, bed mobility, and locomotion level  PARTICIPATION LIMITATIONS: meal prep, cleaning, laundry, driving, shopping, and community activity  PERSONAL FACTORS: Past/current experiences are also affecting patient's functional outcome.   REHAB POTENTIAL: Good  CLINICAL DECISION MAKING: Evolving/moderate complexity  EVALUATION COMPLEXITY: High   GOALS: Goals reviewed with patient? Yes  SHORT TERM GOALS: Target date: 01/25/2022  I with basic HEP Baseline: Goal status: ongoing  LONG TERM GOALS: Target date: 03/22/2022  I with final  HEP Baseline:  Goal status: INITIAL  2.  Improve Tug to < 12 sec Baseline: 20.5 sec Goal status: INITIAL  3.  Improve 5 x STS to < 12 sec Baseline: 24.7 sec Goal status: INITIAL  4.  Improve BLE strength to at least 4/5 Baseline: 3 Goal status: ongoing  5.  Patient will be able to walk at least 500' on level and unlevel surfaces with LRAD, MI, pain < 3/10 Baseline: 80', unsteady on level surfaces and painful Goal status: ongoing  6.  Patient will be able to perform her normal daily activities with knee pain < 3/10 Baseline: 9/10 Goal status: INITIAL  PLAN:  PT FREQUENCY: 1x/week  PT DURATION: 12 weeks  PLANNED INTERVENTIONS: Therapeutic exercises, Therapeutic activity, Neuromuscular re-education, Balance training, Gait training, Patient/Family education, Self Care, Joint mobilization, Stair training, Dry Needling, Electrical stimulation, Cryotherapy, Moist heat, Vasopneumatic device, Ionotophoresis 4mg /ml Dexamethasone, and Manual therapy.  PLAN FOR NEXT SESSION: HEP, acute pain management.    3:14 PM,01/25/22 01/27/22 PT, DPT [facility]  [phone]

## 2022-01-25 ENCOUNTER — Ambulatory Visit: Payer: Commercial Managed Care - HMO | Admitting: Physical Therapy

## 2022-01-25 ENCOUNTER — Encounter: Payer: Self-pay | Admitting: Physical Therapy

## 2022-01-25 DIAGNOSIS — M6281 Muscle weakness (generalized): Secondary | ICD-10-CM

## 2022-01-25 DIAGNOSIS — R262 Difficulty in walking, not elsewhere classified: Secondary | ICD-10-CM | POA: Diagnosis not present

## 2022-01-25 DIAGNOSIS — R278 Other lack of coordination: Secondary | ICD-10-CM

## 2022-01-25 DIAGNOSIS — G8929 Other chronic pain: Secondary | ICD-10-CM

## 2022-01-25 DIAGNOSIS — R600 Localized edema: Secondary | ICD-10-CM

## 2022-01-25 DIAGNOSIS — R2681 Unsteadiness on feet: Secondary | ICD-10-CM

## 2022-02-01 ENCOUNTER — Ambulatory Visit: Payer: Commercial Managed Care - HMO | Admitting: Physical Therapy

## 2022-02-14 ENCOUNTER — Encounter: Payer: Self-pay | Admitting: Physical Therapy

## 2022-02-14 ENCOUNTER — Ambulatory Visit: Payer: Medicaid Other | Attending: Family Medicine | Admitting: Physical Therapy

## 2022-02-14 DIAGNOSIS — R2681 Unsteadiness on feet: Secondary | ICD-10-CM | POA: Diagnosis present

## 2022-02-14 DIAGNOSIS — M6281 Muscle weakness (generalized): Secondary | ICD-10-CM | POA: Insufficient documentation

## 2022-02-14 DIAGNOSIS — R278 Other lack of coordination: Secondary | ICD-10-CM | POA: Insufficient documentation

## 2022-02-14 DIAGNOSIS — M25561 Pain in right knee: Secondary | ICD-10-CM | POA: Diagnosis present

## 2022-02-14 DIAGNOSIS — R262 Difficulty in walking, not elsewhere classified: Secondary | ICD-10-CM | POA: Diagnosis not present

## 2022-02-14 DIAGNOSIS — M25562 Pain in left knee: Secondary | ICD-10-CM | POA: Diagnosis present

## 2022-02-14 DIAGNOSIS — R600 Localized edema: Secondary | ICD-10-CM | POA: Insufficient documentation

## 2022-02-14 DIAGNOSIS — G8929 Other chronic pain: Secondary | ICD-10-CM | POA: Diagnosis present

## 2022-02-14 NOTE — Therapy (Signed)
OUTPATIENT PHYSICAL THERAPY TREATMENT   Patient Name: Jody Spencer MRN: [id number] DOB:1960/02/18, 62 y.o., female Today's Date: 02/14/2022   PT End of Session - 02/14/22 1242     Visit Number 5    Date for PT Re-Evaluation 03/22/22    PT Start Time 1228    PT Stop Time 1310    PT Time Calculation (min) 42 min    Activity Tolerance Patient limited by pain    Behavior During Therapy [facility] for tasks assessed/performed                 No past medical history on file. No past surgical history on file. There are no problems to display for this patient.   PCP: Lavinia Sharps  REFERRING PROVIDER: Tiana Loft, FNP  REFERRING DIAG: Diagnosis G89.29 (ICD-10-CM) - Other chronic pain  Rationale for Evaluation and Treatment: Rehabilitation  THERAPY DIAG:  Difficulty in walking, not elsewhere classified - Plan: PT plan of care cert/re-cert  Localized edema - Plan: PT plan of care cert/re-cert  Muscle weakness (generalized) - Plan: PT plan of care cert/re-cert  Other lack of coordination - Plan: PT plan of care cert/re-cert  Unsteadiness on feet - Plan: PT plan of care cert/re-cert  Bilateral chronic knee pain - Plan: PT plan of care cert/re-cert  ONSET DATE: 12/15/2021  SUBJECTIVE:                                                                                                                                                                                           SUBJECTIVE STATEMENT: Pt states that her knees are severely painful. She has been holding her L knee stiff and has noted discoloration in her L knee. 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 She has an appointment with her rheumatologist on Thursday for follow up regarding the x rays.  PERTINENT HISTORY:  62 yo F with a chief complaints of bilateral leg pain. This been going on for at least 12 years. She tells me that  both of her legs ache from time to time. Usually this is worse at night. All over the legs at times but worse in the knees currently. She denies trauma. Denies back pain denies loss of bowel or bladder denies also partial sensation denies numbness or weakness to the legs. She tells me that she seen multiple doctors and they have not been able to tell her what is wrong. She would like x-rays today to see if they would help.  PAIN:  Are you having pain? Yes: NPRS  scale: 7/10 Pain location: knees  Pain description: constant, can be sharp Aggravating factors: Stair climbing Relieving factors: Ibuprofen helps, but causes nausea  PRECAUTIONS: None  WEIGHT BEARING RESTRICTIONS: No  FALLS:  Has patient fallen in last 6 months? Yes. Number of falls 2 she thinks her knees buckled going up the steps.  LIVING ENVIRONMENT: Lives with: lives with their family Lives in: House/apartment Stairs: Yes: External: 4 steps; bilateral but cannot reach both Has following equipment at home: Single point cane  OCCUPATION: N/A  PLOF: Independent  PATIENT GOALS: Patient would like to identify any exercises she can perform at home to help control the pain.  NEXT MD VISIT: N/A  OBJECTIVE:   DIAGNOSTIC FINDINGS:  N/A SWELLING:  B knees with edema distal to patella  SCREENING FOR RED FLAGS: Bowel or bladder incontinence: No Spinal tumors: No Cauda equina syndrome: No Compression fracture: No Abdominal aneurysm: No  COGNITION: Overall cognitive status: Within functional limits for tasks assessed     SENSATION: Light touch: Impaired   MUSCLE LENGTH: Hamstrings: Right 70 deg; Left 60 deg Thomas test: WFL  POSTURE: rounded shoulders and forward head B genu valgum at knees  PALPATION: B knees TTP on medial/posterior borders, patellar tendon. B patella appear to ride slightly high  LOWER EXTREMITY ROM:   BLE ROM moderately limited in hips and knees due to knee pain    LOWER EXTREMITY MMT:  All  strength limited due to pain  MMT Right eval Left eval  Hip flexion 3+ 3+  Hip extension 3- 3-  Hip abduction    Hip adduction    Hip internal rotation 3_+ 3+  Hip external rotation 3+ 3+  Knee flexion 3+ 3+  Knee extension 3+ 3+  Ankle dorsiflexion 4 4  Ankle plantarflexion    Ankle inversion    Ankle eversion     (Blank rows = not tested)  KNEE SPECIAL TESTS:  Patellar grind test (+) L  FUNCTIONAL TESTS:  5 times sit to stand: 24.69 Timed up and go (TUG): 20.57 Functional gait assessment: TBD  GAIT: Distance walked: 68' Assistive device utilized: Single point cane Level of assistance: Modified independence Comments: Patient reports falls, gait is antalgic, step to, unsteady  TODAY'S TREATMENT:                                                                                                                              DATE:  02/14/22 NuStep L1, minimal movement achieved. MH to L knee and low back x 10 minutes Supine quad sets 10 x 5 sec holds Ankle pumps x 10 reps Mini SAQ, 5 on L, 10 on R Isometric knee flexion 10 reps  01/25/22 Nustep x5 min Supine SAQ x10 reps  Hamstring iso in therapy ball 10x5 sec Seated quad iso at 100 deg flexion x10 reps  MH to B knees x 5 minutes- PT discussing HEP components and answering pt questions  Manual tx McConnell tape  pulling Lt patella laterally. Pt reporting this improved pain with knee flexion   01/18/22 NuStep L3 x 5 minutes with BUE and LE, very small motion Supine LE strengthening exercises-HS, quad sets, SAQ, bridging over bolster, clamshells against G tband at knees MH to B knees x 10 minutes  01/09/22 NuStep L4 with arms and legs. Minimal leg motion achieved, off and on for 5 minutes. Supine SAQ x 10 each leg. Supine pelvic tilt x 10 reps Lower trunk rotation, very limited to each side, 10 reps each way. Supine march Supine heel walk outs Seated long arc quad x 5 with each legs Seated knee flexion, 10 reps  each, AROM   12/28/21  Education   PATIENT EDUCATION:  Education details: POC Person educated: Patient Education method: Explanation Education comprehension: verbalized understanding  HOME EXERCISE PROGRAM: Access Code: [email or web address] URL: [email or web address] Date: 01/25/2022 Prepared by: [facility] - [facility]  Exercises - Hooklying Heel Walk  - 1 x daily - 7 x weekly - 3 reps - Seated Long Arc Quad  - 1 x daily - 7 x weekly - 10 reps - Seated Knee Flexion Slide  - 1 x daily - 7 x weekly - 10 reps - Seated Heel Toe Raises  - 1 x daily - 7 x weekly - 5 reps - Supine Heel Slide  - 1 x daily - 7 x weekly - 1 sets - 10 reps - Supine Short Arc Quad  - 1 x daily - 7 x weekly - 1 sets - 10 reps - Bridge  - 1 x daily - 7 x weekly - 1 sets - 10 reps - Hooklying Clamshell with Resistance  - 1 x daily - 7 x weekly - 1 sets - 10 reps  ASSESSMENT:  CLINICAL IMPRESSION: Pt's pain is much worse since her last visit, with severe guarding to B knees. She was reluctant to perform any type of movement due to fear of pain. Attempted to encourage isometric muscle exercises for BLE. She did report that the MH helped her knees. She has an appointment with her Rheumatologist on Thursday to review the xrays and hopefully make recommendations to manage her pain.  OBJECTIVE IMPAIRMENTS: Abnormal gait, decreased activity tolerance, decreased balance, decreased coordination, decreased endurance, decreased mobility, difficulty walking, decreased ROM, decreased strength, increased muscle spasms, impaired flexibility, impaired sensation, improper body mechanics, postural dysfunction, and pain.   ACTIVITY LIMITATIONS: carrying, lifting, bending, sitting, standing, squatting, sleeping, stairs, transfers, bed mobility, and locomotion level  PARTICIPATION LIMITATIONS: meal prep, cleaning, laundry, driving, shopping, and community activity  PERSONAL FACTORS:  Past/current experiences are also affecting patient's functional outcome.   REHAB POTENTIAL: Good  CLINICAL DECISION MAKING: Evolving/moderate complexity  EVALUATION COMPLEXITY: High   GOALS: Goals reviewed with patient? Yes  SHORT TERM GOALS: Target date: 01/25/2022  I with basic HEP Baseline: Goal status: ongoing  LONG TERM GOALS: Target date: 03/22/2022  I with final HEP Baseline:  Goal status: INITIAL  2.  Improve Tug to < 12 sec Baseline: 20.5 sec Goal status: ongoing  3.  Improve 5 x STS to < 12 sec Baseline: 24.7 sec Goal status: INITIAL  4.  Improve BLE strength to at least 4/5 Baseline: 3 Goal status: ongoing  5.  Patient will be able to walk at least 500' on level and unlevel surfaces with LRAD, MI, pain < 3/10 Baseline: 80', unsteady on level surfaces and painful Goal status: ongoing  6.  Patient will be able to  perform her normal daily activities with knee pain < 3/10 Baseline: 9/10 Goal status: INITIAL  PLAN:  PT FREQUENCY: 1x/week  PT DURATION: 12 weeks  PLANNED INTERVENTIONS: Therapeutic exercises, Therapeutic activity, Neuromuscular re-education, Balance training, Gait training, Patient/Family education, Self Care, Joint mobilization, Stair training, Dry Needling, Electrical stimulation, Cryotherapy, Moist heat, Vasopneumatic device, Ionotophoresis 4mg /ml Dexamethasone, and Manual therapy.  PLAN FOR NEXT SESSION: HEP, acute pain management. What did her Rheumatologist say?   1:18 PM,02/14/22 [place], [place] at [place]

## 2022-02-22 ENCOUNTER — Encounter: Payer: Self-pay | Admitting: Physical Therapy

## 2022-02-22 ENCOUNTER — Ambulatory Visit: Payer: Medicaid Other | Admitting: Physical Therapy

## 2022-02-22 DIAGNOSIS — R2681 Unsteadiness on feet: Secondary | ICD-10-CM

## 2022-02-22 DIAGNOSIS — R600 Localized edema: Secondary | ICD-10-CM

## 2022-02-22 DIAGNOSIS — M6281 Muscle weakness (generalized): Secondary | ICD-10-CM

## 2022-02-22 DIAGNOSIS — R278 Other lack of coordination: Secondary | ICD-10-CM

## 2022-02-22 DIAGNOSIS — R262 Difficulty in walking, not elsewhere classified: Secondary | ICD-10-CM

## 2022-02-22 DIAGNOSIS — G8929 Other chronic pain: Secondary | ICD-10-CM

## 2022-02-22 NOTE — Therapy (Signed)
OUTPATIENT PHYSICAL THERAPY TREATMENT   Patient Name: Jody Spencer MRN: [id number] DOB:02/08/1961, 62 y.o., female Today's Date: 02/22/2022   PT End of Session - 02/22/22 1757     Visit Number 6    Date for PT Re-Evaluation 03/22/22    PT Start Time 1729    PT Stop Time 1757    PT Time Calculation (min) 28 min    Activity Tolerance Patient limited by pain    Behavior During Therapy [facility] for tasks assessed/performed                  No past medical history on file. No past surgical history on file. There are no problems to display for this patient.   PCP: Lavinia Sharps  REFERRING PROVIDER: Tiana Loft, FNP  REFERRING DIAG: Diagnosis G89.29 (ICD-10-CM) - Other chronic pain  Rationale for Evaluation and Treatment: Rehabilitation  THERAPY DIAG:  Difficulty in walking, not elsewhere classified - Plan: PT plan of care cert/re-cert  Localized edema - Plan: PT plan of care cert/re-cert  Muscle weakness (generalized) - Plan: PT plan of care cert/re-cert  Other lack of coordination - Plan: PT plan of care cert/re-cert  Unsteadiness on feet - Plan: PT plan of care cert/re-cert  Bilateral chronic knee pain - Plan: PT plan of care cert/re-cert  ONSET DATE: 12/15/2021  SUBJECTIVE:                                                                                                                                                                                           SUBJECTIVE STATEMENT: Pt states that her knees are somewhat better, but her back is very sore.  PERTINENT HISTORY:  62 yo F with a chief complaints of bilateral leg pain. This been going on for at least 12 years. She tells me that both of her legs ache from time to time. Usually this is worse at night. All over the legs at times but worse in the knees currently. She denies trauma. Denies back pain denies loss of bowel or bladder denies also partial sensation denies numbness or weakness to the legs.  She tells me that she seen multiple doctors and they have not been able to tell her what is wrong. She would like x-rays today to see if they would help.  PAIN:  Are you having pain? Yes: NPRS scale: 7/10 Pain location: knees  Pain description: constant, can be sharp Aggravating factors: Stair climbing Relieving factors: Ibuprofen helps, but causes nausea  PRECAUTIONS: None  WEIGHT BEARING RESTRICTIONS: No  FALLS:  Has patient fallen in last 6 months? Yes. Number of falls 2 she thinks  her knees buckled going up the steps.  LIVING ENVIRONMENT: Lives with: lives with their family Lives in: House/apartment Stairs: Yes: External: 4 steps; bilateral but cannot reach both Has following equipment at home: Single point cane  OCCUPATION: N/A  PLOF: Independent  PATIENT GOALS: Patient would like to identify any exercises she can perform at home to help control the pain.  NEXT MD VISIT: N/A  OBJECTIVE:   DIAGNOSTIC FINDINGS:  N/A SWELLING:  B knees with edema distal to patella  SCREENING FOR RED FLAGS: Bowel or bladder incontinence: No Spinal tumors: No Cauda equina syndrome: No Compression fracture: No Abdominal aneurysm: No  COGNITION: Overall cognitive status: Within functional limits for tasks assessed     SENSATION: Light touch: Impaired   MUSCLE LENGTH: Hamstrings: Right 70 deg; Left 60 deg Thomas test: WFL  POSTURE: rounded shoulders and forward head B genu valgum at knees  PALPATION: B knees TTP on medial/posterior borders, patellar tendon. B patella appear to ride slightly high  LOWER EXTREMITY ROM:   BLE ROM moderately limited in hips and knees due to knee pain    LOWER EXTREMITY MMT:  All strength limited due to pain  MMT Right eval Left eval  Hip flexion 3+ 3+  Hip extension 3- 3-  Hip abduction    Hip adduction    Hip internal rotation 3_+ 3+  Hip external rotation 3+ 3+  Knee flexion 3+ 3+  Knee extension 3+ 3+  Ankle dorsiflexion 4 4   Ankle plantarflexion    Ankle inversion    Ankle eversion     (Blank rows = not tested)  KNEE SPECIAL TESTS:  Patellar grind test (+) L  FUNCTIONAL TESTS:  5 times sit to stand: 24.69 Timed up and go (TUG): 20.57 Functional gait assessment: TBD  GAIT: Distance walked: 53' Assistive device utilized: Single point cane Level of assistance: Modified independence Comments: Patient reports falls, gait is antalgic, step to, unsteady  TODAY'S TREATMENT:                                                                                                                              DATE:  02/22/22 Supine exercises including mini bridge, ball roll and mini bridge with hip IR, Short kicks,  10 each, over Red physioball. Goal of activating muscles with minimal actual motion to decrease pain. Heel slides, 5 reps each leg Ankle pumps x 10 B knee flexion-isometric over bolster, 5 x 5 sec holds.  02/14/22 NuStep L1, minimal movement achieved. MH to L knee and low back x 10 minutes Supine quad sets 10 x 5 sec holds Ankle pumps x 10 reps Mini SAQ, 5 on L, 10 on R Isometric knee flexion 10 reps  01/25/22 Nustep x5 min Supine SAQ x10 reps  Hamstring iso in therapy ball 10x5 sec Seated quad iso at 100 deg flexion x10 reps  MH to B knees x 5 minutes- PT discussing HEP components and answering pt  questions  Manual tx McConnell tape pulling Lt patella laterally. Pt reporting this improved pain with knee flexion   01/18/22 NuStep L3 x 5 minutes with BUE and LE, very small motion Supine LE strengthening exercises-HS, quad sets, SAQ, bridging over bolster, clamshells against G tband at knees MH to B knees x 10 minutes  01/09/22 NuStep L4 with arms and legs. Minimal leg motion achieved, off and on for 5 minutes. Supine SAQ x 10 each leg. Supine pelvic tilt x 10 reps Lower trunk rotation, very limited to each side, 10 reps each way. Supine march Supine heel walk outs Seated long arc quad x 5  with each legs Seated knee flexion, 10 reps each, AROM   12/28/21  Education   PATIENT EDUCATION:  Education details: POC Person educated: Patient Education method: Explanation Education comprehension: verbalized understanding  HOME EXERCISE PROGRAM: Access Code: [email or web address] URL: [email or web address] Date: 01/25/2022 Prepared by: [facility] - [facility]patient Rehab - [facility]  Exercises - Hooklying Heel Walk  - 1 x daily - 7 x weekly - 3 reps - Seated Long Arc Quad  - 1 x daily - 7 x weekly - 10 reps - Seated Knee Flexion Slide  - 1 x daily - 7 x weekly - 10 reps - Seated Heel Toe Raises  - 1 x daily - 7 x weekly - 5 reps - Supine Heel Slide  - 1 x daily - 7 x weekly - 1 sets - 10 reps - Supine Short Arc Quad  - 1 x daily - 7 x weekly - 1 sets - 10 reps - Bridge  - 1 x daily - 7 x weekly - 1 sets - 10 reps - Hooklying Clamshell with Resistance  - 1 x daily - 7 x weekly - 1 sets - 10 reps  ASSESSMENT:  CLINICAL IMPRESSION: Pt arrived 15 min late for treatment. 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 He wants to start some injectable medications for control, but plan on hold for 6 weeks due to her in the middle of possibly changing her insurance. Tolerated slightly increased activity today. Encouraged to perform Hep as much as tolerated.  OBJECTIVE IMPAIRMENTS: Abnormal gait, decreased activity tolerance, decreased balance, decreased coordination, decreased endurance, decreased mobility, difficulty walking, decreased ROM, decreased strength, increased muscle spasms, impaired flexibility, impaired sensation, improper body mechanics, postural dysfunction, and pain.   ACTIVITY LIMITATIONS: carrying, lifting, bending, sitting, standing, squatting, sleeping, stairs, transfers, bed mobility, and locomotion  level  PARTICIPATION LIMITATIONS: meal prep, cleaning, laundry, driving, shopping, and community activity  PERSONAL FACTORS: Past/current experiences are also affecting patient's functional outcome.   REHAB POTENTIAL: Good  CLINICAL DECISION MAKING: Evolving/moderate complexity  EVALUATION COMPLEXITY: High   GOALS: Goals reviewed with patient? Yes  SHORT TERM GOALS: Target date: 01/25/2022  I with basic HEP Baseline: Goal status: ongoing  LONG TERM GOALS: Target date: 03/22/2022  I with final HEP Baseline:  Goal status: INITIAL  2.  Improve Tug to < 12 sec Baseline: 20.5 sec Goal status: ongoing  3.  Improve 5 x STS to < 12 sec Baseline: 24.7 sec Goal status: INITIAL  4.  Improve BLE strength to at least 4/5 Baseline: 3 Goal status: ongoing  5.  Patient will be able to walk at least 500' on level and unlevel surfaces with LRAD, MI, pain < 3/10 Baseline: 80', unsteady on level  surfaces and painful Goal status: ongoing  6.  Patient will be able to perform her normal daily activities with knee pain < 3/10 Baseline: 9/10 Goal status: INITIAL  PLAN:  PT FREQUENCY: 1x/week  PT DURATION: 12 weeks  PLANNED INTERVENTIONS: Therapeutic exercises, Therapeutic activity, Neuromuscular re-education, Balance training, Gait training, Patient/Family education, Self Care, Joint mobilization, Stair training, Dry Needling, Electrical stimulation, Cryotherapy, Moist heat, Vasopneumatic device, Ionotophoresis 4mg /ml Dexamethasone, and Manual therapy.  PLAN FOR NEXT SESSION: HEP, acute pain management. What did her Rheumatologist say?   5:58 PM,02/22/22 [place], [place] at [place]

## 2022-03-01 ENCOUNTER — Ambulatory Visit: Payer: Medicaid Other | Admitting: Physical Therapy

## 2022-03-08 ENCOUNTER — Ambulatory Visit: Payer: Medicaid Other | Admitting: Physical Therapy

## 2022-03-15 ENCOUNTER — Ambulatory Visit: Payer: Medicaid Other | Admitting: Physical Therapy

## 2022-03-29 ENCOUNTER — Ambulatory Visit: Payer: Commercial Managed Care - HMO

## 2022-03-30 ENCOUNTER — Encounter: Payer: Self-pay | Admitting: Family Medicine

## 2022-04-13 ENCOUNTER — Ambulatory Visit
Admission: RE | Admit: 2022-04-13 | Discharge: 2022-04-13 | Disposition: A | Discharge status: Home / Self Care | Payer: Medicaid Other | Source: Ambulatory Visit | Attending: Family Medicine | Admitting: Family Medicine

## 2022-04-13 DIAGNOSIS — Z1231 Encounter for screening mammogram for malignant neoplasm of breast: Secondary | ICD-10-CM

## 2022-04-17 DIAGNOSIS — M2550 Pain in unspecified joint: Secondary | ICD-10-CM | POA: Insufficient documentation

## 2022-04-17 DIAGNOSIS — M459 Ankylosing spondylitis of unspecified sites in spine: Secondary | ICD-10-CM | POA: Insufficient documentation

## 2022-04-17 DIAGNOSIS — N3281 Overactive bladder: Secondary | ICD-10-CM | POA: Insufficient documentation

## 2022-04-17 DIAGNOSIS — G4700 Insomnia, unspecified: Secondary | ICD-10-CM | POA: Insufficient documentation

## 2022-04-18 ENCOUNTER — Other Ambulatory Visit: Payer: Self-pay | Admitting: Family Medicine

## 2022-04-18 DIAGNOSIS — R928 Other abnormal and inconclusive findings on diagnostic imaging of breast: Secondary | ICD-10-CM

## 2022-05-03 ENCOUNTER — Ambulatory Visit: Payer: Medicaid Other

## 2022-05-03 ENCOUNTER — Ambulatory Visit
Admission: RE | Admit: 2022-05-03 | Discharge: 2022-05-03 | Disposition: A | Discharge status: Home / Self Care | Payer: Medicaid Other | Source: Ambulatory Visit | Attending: Family Medicine | Admitting: Family Medicine

## 2022-05-03 DIAGNOSIS — R928 Other abnormal and inconclusive findings on diagnostic imaging of breast: Secondary | ICD-10-CM

## 2022-05-12 ENCOUNTER — Ambulatory Visit: Payer: Commercial Managed Care - HMO

## 2022-05-31 ENCOUNTER — Encounter: Payer: Self-pay | Admitting: Family Medicine

## 2022-06-08 ENCOUNTER — Encounter: Payer: Self-pay | Admitting: Gastroenterology

## 2022-06-20 ENCOUNTER — Ambulatory Visit (AMBULATORY_SURGERY_CENTER): Payer: Medicaid Other

## 2022-06-20 VITALS — Ht 61.0 in | Wt 207.0 lb

## 2022-06-20 DIAGNOSIS — Z1211 Encounter for screening for malignant neoplasm of colon: Secondary | ICD-10-CM

## 2022-06-20 DIAGNOSIS — R928 Other abnormal and inconclusive findings on diagnostic imaging of breast: Secondary | ICD-10-CM

## 2022-06-20 MED ORDER — NA SULFATE-K SULFATE-MG SULF 17.5-3.13-1.6 GM/177ML PO SOLN: 1.0000 | Freq: Once | ORAL | 0 refills | Status: AC

## 2022-06-20 NOTE — Progress Notes (Signed)
No egg or soy allergy known to patient  No issues known to pt with past sedation with any surgeries or procedures Patient denies ever being told they had issues or difficulty with intubation  No FH of Malignant Hyperthermia Pt is not on diet pills Pt is not on  home 02  Pt is not on blood thinners  Pt denies issues with constipation  No A fib or A flutter Have any cardiac testing pending--Patient's chart reviewed by Cathlyn Parsons CNRA prior to previsit and patient appropriate for the [facility].  Previsit completed and red dot placed by patient's name on their procedure day (on provider's schedule).    Pt instructed to use Singlecare.com or GoodRx for a price reduction on prep  Can ambulate without any assistance

## 2022-06-27 ENCOUNTER — Encounter: Payer: Medicaid Other | Admitting: Gastroenterology

## 2022-06-27 ENCOUNTER — Telehealth: Payer: Self-pay | Admitting: Gastroenterology

## 2022-06-27 NOTE — Telephone Encounter (Signed)
Good Afternoon Dr. Tomasa Rand,  I called this patient today at 12:45 to see if she was coming.  She stated she had called and cancelled this appointment because she does not have a care partner.  She will call back to reschedule once she finds a Care partner.  I will NO SHOW Her .  Medicaid

## 2022-08-25 ENCOUNTER — Telehealth: Payer: Self-pay

## 2022-08-25 NOTE — Telephone Encounter (Signed)
Patient called and rescheduled PV for 09/07/22.

## 2022-08-25 NOTE — Telephone Encounter (Signed)
Called at 10:00 and 10:10 for pre-visit.  Unable to reach patient.  Left message she had missed her pre visit and that she needed to call back by 5pm and reschedule her pre-visit or her colonoscopy on 09/20/22 would be cancelled.

## 2022-09-07 ENCOUNTER — Telehealth: Payer: Self-pay

## 2022-09-07 ENCOUNTER — Ambulatory Visit (AMBULATORY_SURGERY_CENTER): Payer: Medicaid Other

## 2022-09-07 VITALS — Ht 61.0 in | Wt 210.0 lb

## 2022-09-07 DIAGNOSIS — Z1211 Encounter for screening for malignant neoplasm of colon: Secondary | ICD-10-CM

## 2022-09-07 MED ORDER — NA SULFATE-K SULFATE-MG SULF 17.5-3.13-1.6 GM/177ML PO SOLN: 1.0000 | Freq: Once | ORAL | 0 refills | Status: AC

## 2022-09-07 NOTE — Telephone Encounter (Addendum)
PV in progress  

## 2022-09-07 NOTE — Progress Notes (Signed)
No egg or soy allergy known to patient  No issues known to pt with past sedation with any surgeries or procedures Patient denies ever being told they had issues or difficulty with intubation  No FH of Malignant Hyperthermia Pt is not on diet pills Pt is not on  home 02  Pt is not on blood thinners  Pt denies issues with constipation  No A fib or A flutter Have any cardiac testing pending--no  LOA: independent  Prep: suprep   Patient's chart reviewed by John Nulty CNRA prior to previsit and patient appropriate for the [facility].  Previsit completed and red dot placed by patient's name on their procedure day (on provider's schedule).     PV competed with patient. Prep instructions sent via mychart and home address. Goodrx coupon for [facility] provided to use for price reduction if needed.  

## 2022-09-20 ENCOUNTER — Encounter: Payer: Self-pay | Admitting: Gastroenterology

## 2022-09-20 ENCOUNTER — Ambulatory Visit (AMBULATORY_SURGERY_CENTER): Payer: Medicaid Other | Admitting: Gastroenterology

## 2022-09-20 VITALS — BP 125/68 | HR 69 | Temp 97.7°F | Resp 11 | Ht 61.0 in | Wt 210.0 lb

## 2022-09-20 DIAGNOSIS — Z1211 Encounter for screening for malignant neoplasm of colon: Secondary | ICD-10-CM | POA: Diagnosis not present

## 2022-09-20 DIAGNOSIS — R195 Other fecal abnormalities: Secondary | ICD-10-CM

## 2022-09-20 DIAGNOSIS — D123 Benign neoplasm of transverse colon: Secondary | ICD-10-CM | POA: Diagnosis not present

## 2022-09-20 DIAGNOSIS — D122 Benign neoplasm of ascending colon: Secondary | ICD-10-CM | POA: Diagnosis not present

## 2022-09-20 DIAGNOSIS — K635 Polyp of colon: Secondary | ICD-10-CM | POA: Diagnosis not present

## 2022-09-20 MED ORDER — SODIUM CHLORIDE 0.9 % IV SOLN: 500.0000 mL | Freq: Once | INTRAVENOUS | Status: DC

## 2022-09-20 NOTE — Patient Instructions (Signed)
Handouts Provided:  Polyps  YOU HAD AN ENDOSCOPIC PROCEDURE TODAY AT [facility]:   Refer to the procedure report that was given to you for any specific questions about what was found during the examination.  If the procedure report does not answer your questions, please call your gastroenterologist to clarify.  If you requested that your care partner not be given the details of your procedure findings, then the procedure report has been included in a sealed envelope for you to review at your convenience later.  YOU SHOULD EXPECT: Some feelings of bloating in the abdomen. Passage of more gas than usual.  Walking can help get rid of the air that was put into your GI tract during the procedure and reduce the bloating. If you had a lower endoscopy (such as a colonoscopy or flexible sigmoidoscopy) you may notice spotting of blood in your stool or on the toilet paper. If you underwent a bowel prep for your procedure, you may not have a normal bowel movement for a few days.  Please Note:  You might notice some irritation and congestion in your nose or some drainage.  This is from the oxygen used during your procedure.  There is no need for concern and it should clear up in a day or so.  SYMPTOMS TO REPORT IMMEDIATELY:  Following lower endoscopy (colonoscopy or flexible sigmoidoscopy):  Excessive amounts of blood in the stool  Significant tenderness or worsening of abdominal pains  Swelling of the abdomen that is new, acute  Fever of 100F or higher  For urgent or emergent issues, a gastroenterologist can be reached at any hour by calling [phone]. Do not use MyChart messaging for urgent concerns.    DIET:  We do recommend a small meal at first, but then you may proceed to your regular diet.  Drink plenty of fluids but you should avoid alcoholic beverages for 24 hours.  ACTIVITY:  You should plan to take it easy for the rest of today and you should NOT DRIVE or use heavy  machinery until tomorrow (because of the sedation medicines used during the test).    FOLLOW UP: Our staff will call the number listed on your records the next business day following your procedure.  We will call around 7:15- 8:00 am to check on you and address any questions or concerns that you may have regarding the information given to you following your procedure. If we do not reach you, we will leave a message.     If any biopsies were taken you will be contacted by phone or by letter within the next 1-3 weeks.  Please call us at [phone] if you have not heard about the biopsies in 3 weeks.    SIGNATURES/CONFIDENTIALITY: You and/or your care partner have signed paperwork which will be entered into your electronic medical record.  These signatures attest to the fact that that the information above on your After Visit Summary has been reviewed and is understood.  Full responsibility of the confidentiality of this discharge information lies with you and/or your care-partner.

## 2022-09-20 NOTE — Op Note (Signed)
[facility] Patient Name: Jody Spencer Procedure Date: 09/20/2022 8:49 AM MRN: [id number] Endoscopist: Lorin Picket E. Tomasa Rand , MD, [id number] Age: 62 Referring MD:  Date of Birth: 1960/02/23 Gender: Female Account #: [id number] Procedure:                Colonoscopy Indications:              Positive Cologuard test Medicines:                Monitored Anesthesia Care Procedure:                Pre-Anesthesia Assessment:                           - Prior to the procedure, a History and Physical                            was performed, and patient medications and                            allergies were reviewed. The patient's tolerance of                            previous anesthesia was also reviewed. The risks                            and benefits of the procedure and the sedation                            options and risks were discussed with the patient.                            All questions were answered, and informed consent                            was obtained. Prior Anticoagulants: The patient has                            taken no anticoagulant or antiplatelet agents. ASA                            Grade Assessment: II - A patient with mild systemic                            disease. After reviewing the risks and benefits,                            the patient was deemed in satisfactory condition to                            undergo the procedure.                           After obtaining informed consent, the colonoscope  was passed under direct vision. Throughout the                            procedure, the patient's blood pressure, pulse, and                            oxygen saturations were monitored continuously. The                            Olympus Scope [id number] was introduced through the                            anus and advanced to the the terminal ileum, with                            identification of the  appendiceal orifice and IC                            valve. The colonoscopy was performed without                            difficulty. The patient tolerated the procedure                            well. The quality of the bowel preparation was                            good. The terminal ileum, ileocecal valve,                            appendiceal orifice, and rectum were photographed.                            The bowel preparation used was SUPREP via split                            dose instruction. Scope In: 9:04:39 AM Scope Out: 9:29:45 AM Scope Withdrawal Time: 0 hours 20 minutes 33 seconds  Total Procedure Duration: 0 hours 25 minutes 6 seconds  Findings:                 The perianal and digital rectal examinations were                            normal. Pertinent negatives include normal                            sphincter tone and no palpable rectal lesions.                           An 11 mm polyp was found in the ascending colon.                            The polyp was sessile. The polyp was removed with  a                            cold snare. Resection and retrieval were complete.                            Estimated blood loss was minimal.                           Three sessile polyps were found in the hepatic                            flexure. The polyps were 2 to 5 mm in size. These                            polyps were removed with a cold snare. Resection                            and retrieval were complete. Estimated blood loss                            was minimal.                           Two sessile polyps were found in the transverse                            colon. The polyps were 3 to 5 mm in size. These                            polyps were removed with a cold snare. Resection                            and retrieval were complete. Estimated blood loss                            was minimal.                           The exam was otherwise  normal throughout the                            examined colon.                           The terminal ileum appeared normal.                           The retroflexed view of the distal rectum and anal                            verge was normal and showed no anal or rectal                            abnormalities.  Complications:            No immediate complications. Estimated Blood Loss:     Estimated blood loss was minimal. Impression:               - One 11 mm polyp in the ascending colon, removed                            with a cold snare. Resected and retrieved.                           - Three 2 to 5 mm polyps at the hepatic flexure,                            removed with a cold snare. Resected and retrieved.                           - Two 3 to 5 mm polyps in the transverse colon,                            removed with a cold snare. Resected and retrieved.                           - The examined portion of the ileum was normal.                           - The distal rectum and anal verge are normal on                            retroflexion view. Recommendation:           - Patient has a contact number available for                            emergencies. The signs and symptoms of potential                            delayed complications were discussed with the                            patient. Return to normal activities tomorrow.                            Written discharge instructions were provided to the                            patient.                           - Resume previous diet.                           - Continue present medications.                           - Await pathology  results.                           - Repeat colonoscopy (date not yet determined) for                            surveillance based on pathology results. Leanette Eutsler E. Tomasa Rand, MD 09/20/2022 9:35:10 AM This report has been signed electronically.

## 2022-09-20 NOTE — Progress Notes (Signed)
Vss nad trans to pacu 

## 2022-09-20 NOTE — Progress Notes (Signed)
Pt's states no medical or surgical changes since previsit or office visit. VS assessed by C.W 

## 2022-09-20 NOTE — Progress Notes (Signed)
[facility] History and Physical   Primary Care Physician:  Jody Decent, Jody Spencer   Reason for Procedure:   Positive Cologuard  Plan:    Colonoscopy     HPI: Jody Spencer is a 62 y.o. female undergoing initial colonoscopy after a reported positive Cologuard test.  She has no family history of colon cancer and no chronic GI symptoms.    Past Medical History:  Diagnosis Date   Anxiety    Arthritis     No past surgical history on file.  Prior to Admission medications   Medication Sig Start Date End Date Taking? Authorizing Provider  albuterol (PROVENTIL HFA;VENTOLIN HFA) 108 (90 Base) MCG/ACT inhaler Inhale 1-2 puffs into the lungs every 6 (six) hours as needed for wheezing or shortness of breath. Patient not taking: Reported on 06/20/2022 03/26/18   Wynetta Fines, MD  clotrimazole-betamethasone (LOTRISONE) cream Apply 1 Application topically 2 (two) times daily.    [provider]  cyclobenzaprine (FLEXERIL) 10 MG tablet Take 1 tablet by mouth 3 times daily as needed for muscle spasm. Warning: May cause drowsiness. Patient not taking: Reported on 06/20/2022 10/13/21   Mardella Layman, MD  cyclobenzaprine (FLEXERIL) 5 MG tablet Take 5 mg by mouth 3 (three) times daily as needed.    [provider]  diazepam (VALIUM) 5 MG tablet Take 5 mg by mouth as needed. 04/24/22   [provider]  gabapentin (NEURONTIN) 100 MG capsule Take 1 capsule(s) 3 times a day by oral route. 02/03/22   [provider]  HUMIRA, 2 PEN, 40 MG/0.4ML pen every 14 (fourteen) days. 08/14/22   [provider]  ketoconazole (NIZORAL) 2 % shampoo LATHER ONTO PROBLEMATIC AREAS OF SKIL AND LEAVE ON FOR 5 MINS BEFORE RINSING OFF. APPLY ONCE DAILY    [provider]  meloxicam (MOBIC) 15 MG tablet Take 1 tablet (15 mg total) by mouth daily. 10/13/21   Mardella Layman, MD  oxybutynin (DITROPAN-XL) 5 MG 24 hr tablet Take 5 mg by mouth daily.    [provider]  traZODone (DESYREL) 100 MG tablet Take 100 mg by mouth at bedtime.    [provider]    Current Outpatient Medications  Medication Sig Dispense Refill   albuterol (PROVENTIL HFA;VENTOLIN HFA) 108 (90 Base) MCG/ACT inhaler Inhale 1-2 puffs into the lungs every 6 (six) hours as needed for wheezing or shortness of breath. (Patient not taking: Reported on 06/20/2022) 1 Inhaler 0   clotrimazole-betamethasone (LOTRISONE) cream Apply 1 Application topically 2 (two) times daily.     cyclobenzaprine (FLEXERIL) 10 MG tablet Take 1 tablet by mouth 3 times daily as needed for muscle spasm. Warning: May cause drowsiness. (Patient not taking: Reported on 06/20/2022) 21 tablet 0   cyclobenzaprine (FLEXERIL) 5 MG tablet Take 5 mg by mouth 3 (three) times daily as needed.     diazepam (VALIUM) 5 MG tablet Take 5 mg by mouth as needed.     gabapentin (NEURONTIN) 100 MG capsule Take 1 capsule(s) 3 times a day by oral route.     HUMIRA, 2 PEN, 40 MG/0.4ML pen every 14 (fourteen) days.     ketoconazole (NIZORAL) 2 % shampoo LATHER ONTO PROBLEMATIC AREAS OF SKIL AND LEAVE ON FOR 5 MINS BEFORE RINSING OFF. APPLY ONCE DAILY     meloxicam (MOBIC) 15 MG tablet Take 1 tablet (15 mg total) by mouth daily. 30 tablet 0   oxybutynin (DITROPAN-XL) 5 MG 24 hr tablet Take 5 mg by mouth daily.  traZODone (DESYREL) 100 MG tablet Take 100 mg by mouth at bedtime.     Current Facility-Administered Medications  Medication Dose Route Frequency Provider Last Rate Last Admin   0.9 %  sodium chloride infusion  500 mL Intravenous Once Jenel Lucks, MD        Allergies as of 09/20/2022   (No Known Allergies)    Family History  Problem Relation Age of Onset   Esophageal cancer Mother    Esophageal cancer Brother    Breast cancer Cousin    Colon cancer Neg Hx    Colon polyps Neg Hx    Rectal cancer Neg Hx    Stomach cancer Neg Hx     Social History   Socioeconomic History   Marital status: Married     Spouse name: Not on file   Number of children: Not on file   Years of education: Not on file   Highest education level: Not on file  Occupational History   Not on file  Tobacco Use   Smoking status: Never   Smokeless tobacco: Never  Vaping Use   Vaping status: Never Used  Substance and Sexual Activity   Alcohol use: Yes    Comment: occasionally wine   Drug use: No   Sexual activity: Not on file  Other Topics Concern   Not on file  Social History Narrative   ** Merged History Encounter **       Social Determinants of Health   Financial Resource Strain: Not on file  Food Insecurity: Not on file  Transportation Needs: Not on file  Physical Activity: Not on file  Stress: Not on file  Social Connections: Not on file  Intimate Partner Violence: Not on file    Review of Systems:  All other review of systems negative except as mentioned in the HPI.  Physical Exam: Vital signs There were no vitals taken for this visit.  General:   Alert,  Well-developed, well-nourished, pleasant and cooperative in NAD Airway:  Mallampati 2 Lungs:  Clear throughout to auscultation.   Heart:  Regular rate and rhythm; no murmurs, clicks, rubs,  or gallops. Abdomen:  Soft, nontender and nondistended. Normal bowel sounds.   Neuro/Psych:  Normal mood and affect. A and O x 3   Lavine Hargrove E. Tomasa Rand, MD [facility]

## 2022-09-20 NOTE — Progress Notes (Signed)
Called to room to assist during endoscopic procedure.  Patient ID and intended procedure confirmed with present staff. Received instructions for my participation in the procedure from the performing physician.  

## 2022-09-21 ENCOUNTER — Telehealth: Payer: Self-pay | Admitting: *Deleted

## 2022-09-21 NOTE — Telephone Encounter (Signed)
Attempted to call patient for their post-procedure follow-up call. No answer. Left voicemail.   

## 2022-09-25 ENCOUNTER — Telehealth: Payer: Self-pay

## 2022-09-25 NOTE — Telephone Encounter (Signed)
Left message for pt to call back if she has any questions. 

## 2022-09-25 NOTE — Telephone Encounter (Signed)
Patient returned the call and asked for a call back has questions.

## 2022-09-26 NOTE — Progress Notes (Signed)
Jody Spencer,   The polyps that I removed during your recent procedure were completely benign but were proven to be "pre-cancerous" polyps that MAY have grown into cancers if they had not been removed.  Studies shows that at least 20% of women over age 60 and 30% of men over age 67 have pre-cancerous polyps.  Based on current nationally recognized surveillance guidelines, I recommend that you have a repeat colonoscopy in 3 years.   If you develop any new rectal bleeding, abdominal pain or significant bowel habit changes, please contact me before then.

## 2022-09-26 NOTE — Telephone Encounter (Signed)
Patient calling to f/u on results? Please advise.   Thank you

## 2022-09-27 ENCOUNTER — Telehealth: Payer: Self-pay | Admitting: *Deleted

## 2022-09-27 NOTE — Telephone Encounter (Signed)
See alternate telephone encounter

## 2022-09-27 NOTE — Telephone Encounter (Signed)
Patient called to get results for colonoscopy. Informed the patient of Dr. Milas Hock results and recommendations. Patient seemed unsure of the information given initially; explained to the patient, the polyps removed were benign. Patient understood and agreed. Recall done for 3 years as per Dr. Tomasa Rand.

## 2023-01-08 ENCOUNTER — Ambulatory Visit: Payer: Medicaid Other | Attending: Internal Medicine | Admitting: Internal Medicine

## 2023-01-08 ENCOUNTER — Encounter: Payer: Self-pay | Admitting: Internal Medicine

## 2023-01-08 VITALS — BP 121/80 | HR 79 | Resp 14 | Ht 62.0 in | Wt 207.0 lb

## 2023-01-08 DIAGNOSIS — M454 Ankylosing spondylitis of thoracic region: Secondary | ICD-10-CM | POA: Diagnosis not present

## 2023-01-08 DIAGNOSIS — M1990 Unspecified osteoarthritis, unspecified site: Secondary | ICD-10-CM | POA: Diagnosis not present

## 2023-01-08 DIAGNOSIS — G8929 Other chronic pain: Secondary | ICD-10-CM | POA: Diagnosis not present

## 2023-01-08 MED ORDER — CELECOXIB 200 MG PO CAPS
200.0000 mg | ORAL_CAPSULE | Freq: Two times a day (BID) | ORAL | 2 refills | Status: AC
Start: 2023-01-08 — End: ?

## 2023-01-08 NOTE — Patient Instructions (Signed)

## 2023-01-08 NOTE — Progress Notes (Unsigned)
Office Visit Note  Patient: Jody Spencer             Date of Birth: 1960-11-05           MRN: [id number]             PCP: Salvatore Decent, PA-C Referring: Salvatore Decent, PA-C Visit Date: 01/08/2023  Subjective:  New Patient (Initial Visit) (Patient states she has joint pain in her knees, back, and fingers. )   Discussed the use of AI scribe software for clinical note transcription with the patient, who gave verbal consent to proceed.  History of Present Illness   Jody Spencer is a 62 y.o. female here for evaluation of chronic joint pain with a history of generalized osteoarthritis and ankylosing spondylitis.  She was previously diagnosed with AS by Dr. 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  The pain has been constant and daily, with the fingers affected for several years and the knees for approximately twelve years. The back pain was initially attributed to a poor mattress, but subsequent imaging revealed fusion of the bones. She was also found to have degenerative joint disease in multiple areas including the cervical and lumbar spine, hands, and bilateral knees especially medial compartment.  She had tried multiple NSAIDs and was initially treated with course of oral steroids with temporary symptom benefit.  Follow-up testing was negative for serum inflammatory markers or HLA-B27 allele.  She started on treatment with Humira for the suspected AS she took this for a few months but did not see a significant improvement in arthritis symptoms.  She did notice increase in swollen bumps in the skin especially in axillary and groin regions these were frequently painful.  She saw awake spine specialist and was treated with steroid injection these were temporarily helpful but benefit has degraded over a month. The patient has also been using a TENS unit for knee pain. Follow-up with both offices was interrupted by loss of  her Medicaid insurance she recently regained insurance coverage with marketplace commercial product and so is getting reestablished for her chronic medical conditions. The patient has been managing the arthritis pain with over-the-counter medications such as Tylenol and ibuprofen, but reports that ibuprofen causes stomach discomfort.  The patient also reports a hunch in the back, which has been noticed by family members. The patient has been attending physical therapy for four years, but had to stop due to a lapse in Medicaid coverage. The patient has recently obtained insurance through the marketplace and is considering resuming physical therapy.    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   Patient Reports nocturnal pain.  Difficulty dressing/grooming: Denies Difficulty climbing stairs: Reports Difficulty getting out of chair: Denies Difficulty using hands for taps, buttons, cutlery, and/or writing: Reports  Review of Systems  Constitutional:  Positive for fatigue.  HENT:  Negative for mouth sores and mouth dryness.   Eyes:  Negative for dryness.  Respiratory:  Negative for shortness of breath.   Cardiovascular:  Negative for chest pain and palpitations.  Gastrointestinal:  Negative for blood in stool, constipation and diarrhea.  Endocrine: Positive for increased urination.  Genitourinary:  Positive for involuntary urination.  Musculoskeletal:  Positive for joint pain, gait problem, joint pain, joint swelling, myalgias, morning stiffness and myalgias. Negative for muscle weakness and  muscle tenderness.  Skin:  Negative for color change, rash, hair loss and sensitivity to sunlight.  Allergic/Immunologic: Negative for susceptible to infections.  Neurological:  Negative for dizziness and headaches.  Hematological:  Negative for swollen glands.   Psychiatric/Behavioral:  Positive for depressed mood and sleep disturbance. The patient is nervous/anxious.     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   Objective: Vital Signs: BP 121/80 (BP Location: Right Arm, Patient Position: Sitting, Cuff Size: Normal)   Pulse 79   Resp 14   Ht 5\' 2"  (1.575 m)   Wt 207 lb (93.9 kg)   BMI 37.86 kg/m    Physical Exam Constitutional:      Appearance: She is obese.  Eyes:     Conjunctiva/sclera: Conjunctivae normal.  Cardiovascular:     Rate and Rhythm: Normal rate and regular rhythm.  Pulmonary:     Effort: Pulmonary effort is normal.     Breath sounds: Normal breath sounds.  Lymphadenopathy:     Cervical: No cervical adenopathy.  Skin:    General: Skin is warm and dry.  Neurological:     Mental Status: She is alert.  Psychiatric:        Mood and Affect: Mood normal.      Musculoskeletal Exam:  Shoulders full ROM no tenderness or swelling Elbows full ROM no tenderness or swelling Wrists full ROM no tenderness or swelling Fingers full ROM no tenderness or swelling, nontender, firm, mobile nodule on dorsum of right 3rd PIP Mild  tenderness to pressure midline and paraspinal mid back Knees full ROM no swelling, bilateral patellofemoral crepitus, describes pain "inside the knee" with movement Ankles full ROM no tenderness or swelling    Investigation: No additional findings.  Imaging: No results found.  Recent Labs: Lab Results  Component Value Date   WBC 4.6 08/23/2016   HGB 14.1 08/23/2016   PLT 259 08/23/2016   NA 139 01/08/2023   K 4.0 01/08/2023   CL 101 01/08/2023   CO2 29 01/08/2023   GLUCOSE 83 01/08/2023   BUN 10 01/08/2023   CREATININE 0.73 01/08/2023   CALCIUM 8.9 01/08/2023   GFRAA >60 08/23/2016    Speciality Comments: No specialty comments available.  Procedures:  No procedures performed Allergies: Patient has no known allergies.   Assessment / Plan:     Visit Diagnoses: Ankylosing spondylitis of thoracic region [facility]) - Plan: Sedimentation rate, C-reactive protein  Possible Ankylosing Spondylitis Thoracic spine shows several levels fused together, raising concern for an inflammatory  condition. However, the pattern of involvement is atypical and previous inflammatory markers were negative. 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 -Order blood tests to check inflammatory markers. -Consider alternative injectable medications if inflammatory markers are elevated.  Osteoarthritis Chronic pain in fingers, knees, and back. Imaging shows osteoarthritis in knees and degenerative changes in the spine. Previous treatment with Humira was discontinued due to adverse skin reactions and lack of efficacy. -Start Celebrex as an alternative to ibuprofen for pain management. -Consider physical therapy for symptom management. -Consider steroid injections or viscosupplementation for knee pain if symptoms persist.  Nodule on Right Third Finger Chronic, hard nodule present for over ten years.   Orders: Orders Placed This Encounter  Procedures    Sedimentation rate   C-reactive protein   BASIC METABOLIC PANEL WITH GFR   Meds ordered this encounter  Medications   celecoxib (CELEBREX) 200 MG capsule    Sig: Take 1 capsule (200 mg total) by mouth 2 (two) times daily.    Dispense:  60 capsule    Refill:  2     Follow-Up Instructions: Return in about 4 weeks (around 02/05/2023) for ?AS New pt f/u 66mo.   Fuller Plan, MD  Note - This record has been created using [employer].  Chart creation errors have been sought, but may not always  have been located. Such creation errors do not reflect on  the standard of medical care.

## 2023-01-09 LAB — BASIC METABOLIC PANEL WITH GFR
BUN: 10 mg/dL (ref 7–25)
CO2: 29 mmol/L (ref 20–32)
Calcium: 8.9 mg/dL (ref 8.6–10.4)
Chloride: 101 mmol/L (ref 98–110)
Creat: 0.73 mg/dL (ref 0.50–1.05)
Glucose, Bld: 83 mg/dL (ref 65–99)
Potassium: 4 mmol/L (ref 3.5–5.3)
Sodium: 139 mmol/L (ref 135–146)
eGFR: 94 mL/min/{1.73_m2} (ref >=60)

## 2023-01-09 LAB — SEDIMENTATION RATE: Sed Rate: 2 mm/h (ref 0–30)

## 2023-01-09 LAB — C-REACTIVE PROTEIN: CRP: <3 mg/L (ref <8.0)

## 2023-01-31 ENCOUNTER — Emergency Department (HOSPITAL_COMMUNITY)
Admission: EM | Admit: 2023-01-31 | Discharge: 2023-01-31 | Disposition: A | Discharge status: Home / Self Care | Payer: Medicaid Other

## 2023-01-31 ENCOUNTER — Emergency Department (HOSPITAL_COMMUNITY): Payer: Medicaid Other

## 2023-01-31 ENCOUNTER — Other Ambulatory Visit: Payer: Self-pay

## 2023-01-31 DIAGNOSIS — R42 Dizziness and giddiness: Secondary | ICD-10-CM | POA: Insufficient documentation

## 2023-01-31 DIAGNOSIS — R5383 Other fatigue: Secondary | ICD-10-CM | POA: Insufficient documentation

## 2023-01-31 DIAGNOSIS — R531 Weakness: Secondary | ICD-10-CM | POA: Diagnosis not present

## 2023-01-31 LAB — CBC
HCT: 40.4 % (ref 36.0–46.0)
Hemoglobin: 13.3 g/dL (ref 12.0–15.0)
MCH: 31.2 pg (ref 26.0–34.0)
MCHC: 32.9 g/dL (ref 30.0–36.0)
MCV: 94.8 fL (ref 80.0–100.0)
Platelets: 200 10*3/uL (ref 150–400)
RBC: 4.26 MIL/uL (ref 3.87–5.11)
RDW: 12.8 % (ref 11.5–15.5)
WBC: 5.2 10*3/uL (ref 4.0–10.5)
nRBC: 0 % (ref 0.0–0.2)

## 2023-01-31 LAB — HEPATIC FUNCTION PANEL
ALT: 19 U/L (ref 0–44)
AST: 23 U/L (ref 15–41)
Albumin: 3.5 g/dL (ref 3.5–5.0)
Alkaline Phosphatase: 65 U/L (ref 38–126)
Bilirubin, Direct: <0.1 mg/dL (ref 0.0–0.2)
Total Bilirubin: 0.4 mg/dL (ref <1.2)
Total Protein: 6.1 g/dL — ABNORMAL LOW (ref 6.5–8.1)

## 2023-01-31 LAB — URINALYSIS, ROUTINE W REFLEX MICROSCOPIC
Bilirubin Urine: NEGATIVE
Glucose, UA: NEGATIVE mg/dL
Hgb urine dipstick: NEGATIVE
Ketones, ur: NEGATIVE mg/dL
Leukocytes,Ua: NEGATIVE
Nitrite: NEGATIVE
Protein, ur: NEGATIVE mg/dL
Specific Gravity, Urine: 1.014 (ref 1.005–1.030)
pH: 7 (ref 5.0–8.0)

## 2023-01-31 LAB — BASIC METABOLIC PANEL
Anion gap: 6 (ref 5–15)
BUN: 10 mg/dL (ref 6–20)
CO2: 25 mmol/L (ref 22–32)
Calcium: 8.6 mg/dL — ABNORMAL LOW (ref 8.9–10.3)
Chloride: 106 mmol/L (ref 98–111)
Creatinine, Ser: 0.54 mg/dL (ref 0.44–1.00)
GFR, Estimated: >60 mL/min (ref >60)
Glucose, Bld: 116 mg/dL — ABNORMAL HIGH (ref 70–99)
Potassium: 3.7 mmol/L (ref 3.5–5.1)
Sodium: 137 mmol/L (ref 135–145)

## 2023-01-31 LAB — TROPONIN I (HIGH SENSITIVITY): Troponin I (High Sensitivity): 4 ng/L (ref <18)

## 2023-01-31 LAB — CBG MONITORING, ED: Glucose-Capillary: 112 mg/dL — ABNORMAL HIGH (ref 70–99)

## 2023-01-31 MED ORDER — MECLIZINE HCL 25 MG PO TABS: 25.0000 mg | ORAL_TABLET | Freq: Three times a day (TID) | ORAL | 0 refills | Status: AC | PRN

## 2023-01-31 MED ORDER — SODIUM CHLORIDE 0.9 % IV BOLUS
1000.0000 mL | Freq: Once | INTRAVENOUS | Status: AC
  Administered 2023-01-31: 1000 mL via INTRAVENOUS

## 2023-01-31 MED ORDER — KETOROLAC TROMETHAMINE 15 MG/ML IJ SOLN
15.0000 mg | Freq: Once | INTRAMUSCULAR | Status: AC
  Administered 2023-01-31: 15 mg via INTRAVENOUS
  Filled 2023-01-31: qty 1

## 2023-01-31 MED ORDER — DIAZEPAM 2 MG PO TABS
2.0000 mg | ORAL_TABLET | Freq: Once | ORAL | Status: AC
  Administered 2023-01-31: 2 mg via ORAL
  Filled 2023-01-31: qty 1

## 2023-01-31 MED ORDER — MECLIZINE HCL 25 MG PO TABS
25.0000 mg | ORAL_TABLET | Freq: Once | ORAL | Status: AC
  Administered 2023-01-31: 25 mg via ORAL
  Filled 2023-01-31: qty 1

## 2023-01-31 MED ORDER — DIPHENHYDRAMINE HCL 50 MG/ML IJ SOLN
25.0000 mg | Freq: Once | INTRAMUSCULAR | Status: AC
  Administered 2023-01-31: 25 mg via INTRAVENOUS
  Filled 2023-01-31: qty 1

## 2023-01-31 MED ORDER — METOCLOPRAMIDE HCL 5 MG/ML IJ SOLN
10.0000 mg | Freq: Once | INTRAMUSCULAR | Status: AC
Start: 2023-01-31 — End: 2023-01-31
  Administered 2023-01-31: 10 mg via INTRAVENOUS
  Filled 2023-01-31: qty 2

## 2023-01-31 MED ORDER — ONDANSETRON 4 MG PO TBDP: 4.0000 mg | ORAL_TABLET | Freq: Three times a day (TID) | ORAL | 0 refills | Status: AC | PRN

## 2023-01-31 NOTE — ED Provider Notes (Signed)
[place] EMERGENCY DEPARTMENT AT [facility] Provider Note   CSN: [id number] Arrival date & time: 01/31/23  1623     History  Chief Complaint  Patient presents with   Dizziness    Jody Spencer is a 62 y.o. female, history of anxiety, arthritis, who presents to the ED secondary to lightheadedness, and dizziness, this been going on for the last day.  She states that yesterday, she gave plasma, but she did not get IV fluids after her plasma, donation as she typically does.  She notes that she felt more fatigued, but drink some Gatorade, that everything was going to be fine.  She states she woke up today, and is very lightheaded, and dizzy when standing.  Denies any hearing changes, vision changes, or weakness on one side of the body.  Just feels generalized with weakness.  Denies any chest pain, shortness of breath, or palpitations.  Home Medications Prior to Admission medications   Medication Sig Start Date End Date Taking? Authorizing Provider  meclizine (ANTIVERT) 25 MG tablet Take 1 tablet (25 mg total) by mouth 3 (three) times daily as needed for dizziness. 01/31/23  Yes Cataleah Stites L, PA  ondansetron (ZOFRAN-ODT) 4 MG disintegrating tablet Take 1 tablet (4 mg total) by mouth every 8 (eight) hours as needed. 01/31/23  Yes Dyllon Henken L, PA  albuterol (PROVENTIL HFA;VENTOLIN HFA) 108 (90 Base) MCG/ACT inhaler Inhale 1-2 puffs into the lungs every 6 (six) hours as needed for wheezing or shortness of breath. Patient not taking: Reported on 06/20/2022 03/26/18   Wynetta Fines, MD  celecoxib (CELEBREX) 200 MG capsule Take 1 capsule (200 mg total) by mouth 2 (two) times daily. 01/08/23   Rice, Jamesetta Orleans, MD  clotrimazole-betamethasone (LOTRISONE) cream Apply 1 Application topically 2 (two) times daily.    [provider]  cyclobenzaprine (FLEXERIL) 10 MG tablet Take 1 tablet by mouth 3 times daily as needed for muscle spasm. Warning: May cause drowsiness.  10/13/21   Mardella Layman, MD  cyclobenzaprine (FLEXERIL) 5 MG tablet Take 5 mg by mouth 3 (three) times daily as needed.    [provider]  diazepam (VALIUM) 5 MG tablet Take 5 mg by mouth as needed. Patient not taking: Reported on 09/20/2022 04/24/22   [provider]  gabapentin (NEURONTIN) 100 MG capsule Take 1 capsule(s) 3 times a day by oral route. Patient not taking: Reported on 01/08/2023 02/03/22   [provider]  HUMIRA, 2 PEN, 40 MG/0.4ML pen every 14 (fourteen) days. Patient not taking: Reported on 01/08/2023 08/14/22   [provider]  ketoconazole (NIZORAL) 2 % shampoo     [provider]  oxybutynin (DITROPAN-XL) 5 MG 24 hr tablet Take 5 mg by mouth daily.    [provider]  traZODone (DESYREL) 100 MG tablet Take 100 mg by mouth at bedtime.    [provider]      Allergies    Patient has no known allergies.    Review of Systems   Review of Systems  Respiratory:  Negative for shortness of breath.   Cardiovascular:  Negative for chest pain.  Neurological:  Positive for dizziness.    Physical Exam Updated Vital Signs BP (!) 156/74   Pulse 65   Temp (!) 97.5 F (36.4 C) (Oral)   Resp 14   Ht 5\' 2"  (1.575 m)   Wt 95.3 kg   SpO2 100%   BMI 38.41 kg/m  Physical Exam Vitals and nursing note reviewed.  Constitutional:      General: She is not in acute distress.    Appearance: She is well-developed.  HENT:     Head: Normocephalic and atraumatic.     Mouth/Throat:     Mouth: Mucous membranes are dry.  Eyes:     Conjunctiva/sclera: Conjunctivae normal.  Cardiovascular:     Rate and Rhythm: Normal rate and regular rhythm.     Heart sounds: No murmur heard. Pulmonary:     Effort: Pulmonary effort is normal. No respiratory distress.     Breath sounds: Normal breath sounds.  Abdominal:     Palpations: Abdomen is soft.     Tenderness: There is no abdominal tenderness.  Musculoskeletal:        General: No  swelling.     Cervical back: Neck supple.  Skin:    General: Skin is warm and dry.     Capillary Refill: Capillary refill takes less than 2 seconds.  Neurological:     General: No focal deficit present.     Mental Status: She is alert and oriented to person, place, and time.     Cranial Nerves: No cranial nerve deficit.     Sensory: No sensory deficit.     Motor: No weakness.  Psychiatric:        Mood and Affect: Mood normal.     ED Results / Procedures / Treatments   Labs (all labs ordered are listed, but only abnormal results are displayed) Labs Reviewed  BASIC METABOLIC PANEL - Abnormal; Notable for the following components:      Result Value   Glucose, Bld 116 (*)    Calcium 8.6 (*)    All other components within normal limits  URINALYSIS, ROUTINE W REFLEX MICROSCOPIC - Abnormal; Notable for the following components:   Color, Urine STRAW (*)    All other components within normal limits  HEPATIC FUNCTION PANEL - Abnormal; Notable for the following components:   Total Protein 6.1 (*)    All other components within normal limits  CBG MONITORING, ED - Abnormal; Notable for the following components:   Glucose-Capillary 112 (*)    All other components within normal limits  CBC  TROPONIN I (HIGH SENSITIVITY)    EKG None  Radiology CT Head Wo Contrast Result Date: 01/31/2023 CLINICAL DATA:  Syncope and dizziness EXAM: CT HEAD WITHOUT CONTRAST TECHNIQUE: Contiguous axial images were obtained from the base of the skull through the vertex without intravenous contrast. RADIATION DOSE REDUCTION: This exam was performed according to the departmental dose-optimization program which includes automated exposure control, adjustment of the mA and/or kV according to patient size and/or use of iterative reconstruction technique. COMPARISON:  None Available. FINDINGS: Brain: No mass,hemorrhage or extra-axial collection. Normal appearance of the parenchyma and CSF spaces. Vascular: No  hyperdense vessel or unexpected vascular calcification. Skull: The visualized skull base, calvarium and extracranial soft tissues are normal. Sinuses/Orbits: No fluid levels or advanced mucosal thickening of the visualized paranasal sinuses. No mastoid or middle ear effusion. Normal orbits. IMPRESSION: Normal head CT. Electronically Signed   By: Deatra Robinson M.D.   On: 01/31/2023 21:55    Procedures Procedures    Medications Ordered in ED Medications  sodium chloride 0.9 % bolus 1,000 mL (0 mLs Intravenous Stopped 01/31/23 2124)  meclizine (ANTIVERT) tablet 25 mg (25 mg Oral Given 01/31/23 2020)  diazepam (VALIUM) tablet 2 mg (2 mg Oral Given 01/31/23 2252)  ketorolac (TORADOL) 15 MG/ML injection 15 mg (15 mg Intravenous Given 01/31/23  2257)  diphenhydrAMINE (BENADRYL) injection 25 mg (25 mg Intravenous Given 01/31/23 2257)  metoCLOPramide (REGLAN) injection 10 mg (10 mg Intravenous Given 01/31/23 2258)    ED Course/ Medical Decision Making/ A&P                                 Medical Decision Making Patient is a 62 year old female, complaint of dizziness, has been going on since this morning.  She has no neurodeficits on my exam is overall well-appearing.  Ordered electrolytes, as this happened after she gave plasma, and they did not give her IV fluids.  She is feels weak.  Will obtain orthostatics, for further evaluation, and start IV fluids.  Amount and/or Complexity of Data Reviewed Labs: ordered.    Details: No electrolyte deficiencies Radiology: ordered.    Details: CT head within normal limits ECG/medicine tests:  Decision-making details documented in ED Course.    Details: Normal sinus rhythm Discussion of management or test interpretation with external provider(s): Discussed with patient, she has no neurodeficits, however given persistent dizziness, CT head, was ordered, and was within normal limits.  I gave her IV fluids, and a cocktail, for headache, and her symptoms  resolved.  I am suspicious that this may represent an atypical migraine, versus possible vertigo.  She states she is feeling better and would like to go home.  Was discharged, with follow-up with PCP, sent meclizine, Zofran to the pharmacy, in case persistent dizziness, secondary to etiology of possible BPPV.  We discussed return precautions and she voiced understanding discharged home  Risk Prescription drug management.   Final Clinical Impression(s) / ED Diagnoses Final diagnoses:  Dizziness    Rx / DC Orders ED Discharge Orders          Ordered    meclizine (ANTIVERT) 25 MG tablet  3 times daily PRN        01/31/23 2329    ondansetron (ZOFRAN-ODT) 4 MG disintegrating tablet  Every 8 hours PRN        01/31/23 2330              Arash Karstens L, PA 01/31/23 2337    Coral Spikes, DO 01/31/23 2342

## 2023-01-31 NOTE — Discharge Instructions (Signed)
Your symptoms are suspicious for an atypical migraine, versus vertigo.  Please follow the Epley maneuver, if your dizziness is recurrent.  If you have severe headache, intractable nausea, vomiting please return to the ER.  I have sent you some dizzy medication, to the pharmacy, as well as some antinausea medicine.

## 2023-01-31 NOTE — ED Triage Notes (Signed)
Pt BIB [facility] from home. Reports pt woke up this morning with dizziness and it's worsening. Denies Prior medical HX, meds. Pt reports donating plasma yesterday and stated that they did not give her saline and had her drink Gatorade before leaving.

## 2023-02-18 ENCOUNTER — Telehealth: Payer: Self-pay | Admitting: Neurology

## 2023-02-18 NOTE — Telephone Encounter (Signed)
 Pt was called and informed that due to  inclement weather her appointment had to be cancelled, and that within the week she will be called to be rescheduled, pt was thankful for the call.

## 2023-02-19 ENCOUNTER — Ambulatory Visit: Payer: Medicaid Other | Admitting: Neurology

## 2023-02-26 ENCOUNTER — Telehealth: Payer: Self-pay | Admitting: Neurology

## 2023-02-26 ENCOUNTER — Encounter: Payer: Self-pay | Admitting: Neurology

## 2023-02-26 ENCOUNTER — Ambulatory Visit (INDEPENDENT_AMBULATORY_CARE_PROVIDER_SITE_OTHER): Payer: Medicaid Other | Admitting: Neurology

## 2023-02-26 VITALS — BP 142/85 | HR 62 | Ht 61.0 in | Wt 210.2 lb

## 2023-02-26 DIAGNOSIS — H5509 Other forms of nystagmus: Secondary | ICD-10-CM | POA: Diagnosis not present

## 2023-02-26 DIAGNOSIS — R110 Nausea: Secondary | ICD-10-CM | POA: Insufficient documentation

## 2023-02-26 DIAGNOSIS — R42 Dizziness and giddiness: Secondary | ICD-10-CM | POA: Diagnosis not present

## 2023-02-26 NOTE — Progress Notes (Signed)
 [facility]  Provider:  Dr Chalice Referring Provider: Linde Bitters, PA-C Primary Care Physician:  Linde Bitters, PA-C  Chief Complaint  Patient presents with   New Patient (Initial Visit)    Pt in room 2 alone.  New patient here for Vertigo. Pt reports was seen in ER in Dec. Took Ondansetron  today. Pt reports dizziness all the time. Pt reports her head feel foggy. Pt c/o pressure sensation in head, unable to lay flat to due orthostatic vitals pt said her head hurts too much when laying flat.     HPI:  Anselma Herbel is a 63 y.o. female and seen here upon referral from [place] Bitters Linde for a Consultation/ Evaluation of vertigo.   This patient reports onset of vertigo  over a period of less than one month, since 01-31-2023. I get better with medicine but it comes back every day again.   Histoyr Osteo-arthriis , back surgery , walks with 4 prong cane.   Out of fear of falling she is using the cane again.  Mrs. Patnode is a 63 year old right-handed African-American female with an acute onset of severe vertigo associated with nausea on 31 January 2023.  This condition brought her to the emergency room and also to  [facility]  @ [address] [place] in [place],  [place] .   There she was seen by Bitters Linde, PA reviewing the medical record there were no labs obtained the patient was given meclizine  and ondansetron  for nausea and vertigo.  No procedures or surgeries on record, no imaging on record, neurology referral on 02/05/2023.  Past medical history positive for arthritis, ankylosing spondylitis, mixed anxiety and depression, obesity, multiple joint pain, new onset vertigo, the patient is treated with trazodone to help her sleep, meclizine  was refilled for 30 days on December 23, she also has used Detrol for bladder hyperactivity, she has been treating treated with diclofenac for her arthritis, Humira for her  arthritis. Reportedly she had vertigo for the past 5 years when she saw the [facility], previously seen in the ED she had only a CAT scan of her head but no MRI.  This CAT scan was normal, she had episodic vertigo 3-4 times a day each episode lasting about an hour.  She endorsed nausea with it but no headache or sensitivity to light no neck stiffness, balance problems,  Epley maneuvers were attempted without relief.  Here today a orthostatic blood pressure was obtained  See medical examination note.  The patient is widowed she does not smoke or use tobacco or nicotine products, she is not a former smoker, she does not use caffeine, she very rarely uses alcohol.  She is widowed from an former sales executive and since she receives a widows pension she no longer receives her own disability payments.   She had been disabled for several years,  beginning in 2019 with the dx of psoriatic arthritis.       Review of Systems: Out of a complete 14 system review, the patient complains of only the following symptoms, and all other reviewed systems are negative.   Social History   Socioeconomic History   Marital status: Married    Spouse name: Not on file   Number of children: Not on file   Years of education: Not on file   Highest education level: Not on file  Occupational History   Not on file  Tobacco Use   Smoking status: Never    Passive  exposure: Current   Smokeless tobacco: Never  Vaping Use   Vaping status: Never Used  Substance and Sexual Activity   Alcohol use: Yes    Comment: occasionally wine   Drug use: No   Sexual activity: Not on file  Other Topics Concern   Not on file  Social History Narrative   ** Merged History Encounter **       Right handed    Reader glasses   Rare coffee usage   Social Drivers of Health   Financial Resource Strain: Not on file  Food Insecurity: Not on file  Transportation Needs: Not on file  Physical Activity: Not on file  Stress: Not  on file  Social Connections: Not on file  Intimate Partner Violence: Not on file    Family History  Problem Relation Age of Onset   Esophageal cancer Mother    Esophageal cancer Brother    Breast cancer Cousin    Colon cancer Neg Hx    Colon polyps Neg Hx    Rectal cancer Neg Hx    Stomach cancer Neg Hx     Past Medical History:  Diagnosis Date   Anxiety    Arthritis     Past Surgical History:  Procedure Laterality Date   back injections      Current Outpatient Medications  Medication Sig Dispense Refill   celecoxib  (CELEBREX ) 200 MG capsule Take 1 capsule (200 mg total) by mouth 2 (two) times daily. 60 capsule 2   clotrimazole-betamethasone (LOTRISONE) cream Apply 1 Application topically 2 (two) times daily.     cyclobenzaprine  (FLEXERIL ) 10 MG tablet Take 1 tablet by mouth 3 times daily as needed for muscle spasm. Warning: May cause drowsiness. 21 tablet 0   cyclobenzaprine  (FLEXERIL ) 5 MG tablet Take 5 mg by mouth 3 (three) times daily as needed.     diazepam  (VALIUM ) 5 MG tablet Take 5 mg by mouth as needed.     gabapentin (NEURONTIN) 100 MG capsule      ketoconazole (NIZORAL) 2 % shampoo      meclizine  (ANTIVERT ) 25 MG tablet Take 1 tablet (25 mg total) by mouth 3 (three) times daily as needed for dizziness. 30 tablet 0   ondansetron  (ZOFRAN -ODT) 4 MG disintegrating tablet Take 1 tablet (4 mg total) by mouth every 8 (eight) hours as needed. 20 tablet 0   oxybutynin (DITROPAN-XL) 5 MG 24 hr tablet Take 5 mg by mouth daily.     traZODone (DESYREL) 100 MG tablet Take 100 mg by mouth at bedtime.     albuterol  (PROVENTIL  HFA;VENTOLIN  HFA) 108 (90 Base) MCG/ACT inhaler Inhale 1-2 puffs into the lungs every 6 (six) hours as needed for wheezing or shortness of breath. (Patient not taking: Reported on 02/26/2023) 1 Inhaler 0   HUMIRA, 2 PEN, 40 MG/0.4ML pen every 14 (fourteen) days. (Patient not taking: Reported on 02/26/2023)     No current facility-administered medications for  this visit.    Allergies as of 02/26/2023   (No Known Allergies)    Vitals: BP (!) 142/85 (BP Location: Left Arm, Patient Position: Sitting, Cuff Size: Normal)   Pulse 62   Ht 5' 1 (1.549 m)   Wt 210 lb 3.2 oz (95.3 kg)   BMI 39.72 kg/m    Couldn't tolerate supine BP measurements, BP not taken- felt ready to vomit, head spinning  Seated BP was not obtained. Standing was obtained twice 157/ 94 mmHg  66 ,, BP 164/ 94 , HR  64  bpm     142/ 85 HR 62 bpm. Regular   Last Weight:  Wt Readings from Last 1 Encounters:  02/26/23 210 lb 3.2 oz (95.3 kg)   Last Height:   Ht Readings from Last 1 Encounters:  02/26/23 5' 1 (1.549 m)    Physical exam:  General: The patient is awake, alert and appears in acute distress.  The patient is well groomed. Head: Normocephalic, atraumatic.  Neck is supple. No Goiter   Neck circumference: 14  Cardiovascular:  Regular rate and palpable peripheral pulse:  Respiratory: clear to auscultation. Skin:  Without evidence of edema, or rash   Neurologic exam : The patient is awake and alert, oriented to place and time.  Memory subjective  described as intact.  There is a normal attention span & concentration ability.  Speech is fluent without  dysarthria, dysphonia or aphasia.  Mood and affect are appropriate.  Cranial nerves: Pupils are equal and briskly reactive to light. Funduscopic exam without  evidence of pallor or edema. Extraocular movements  in vertical and horizontal planes intact , saccadic  nystagmus  with gaze to the left.  Visual fields by finger perimetry are intact. Hearing to finger rub intact.  Facial sensation intact to fine touch. Facial motor strength is symmetric and tongue and uvula move midline.  Motor exam:   Normal tone and normal muscle bulk and symmetric normal strength in upper extremities. Grip Strength equal  Proximal strength of shoulder muscles and hip flexors was intact   Sensory:  Fine touch and vibration  were tested . Proprioception was tested in the upper extremities only and was normal.  Coordination: Rapid alternating movements in the fingers/hands were slowed   Finger-to-nose maneuver was tested and showed evidence of ataxia, dysmetria  but no tremor.  Gait and station: Patient walked with assistive device -  Deep tendon reflexes: in the  upper and lower extremities are symmetric.  Assessment: Total time for face to face interview and examination, for review of  images and laboratory testing, neurophysiology testing and pre-existing records, including out-of -network , was 45 minutes. Assessment is as follows here:  1)   vertigo worsened with gaze to the left-  there is nystagmus,  she drifts to the right while walking, she is nauseated ,  spinning sensation for left to right, clock wise,  worst when laying down no facial numbness, no pain, no trouble speaking or swallowing.   Plan:  Treatment plan and additional workup planned after today includes:   1)  The patient needs an MRI MRA of the brain , rule out stroke.  2)   Referral to vestibular rehab .  3) family history of HTN,  stroke , maternal family.      Dedra Gores, MD

## 2023-02-26 NOTE — Telephone Encounter (Signed)
 [facility]: [id number] exp. 02/26/23-03/28/23 sent to GI [phone]

## 2023-02-26 NOTE — Patient Instructions (Signed)
 Vertigo Vertigo is the feeling that you or the things around you are moving when they are not. This feeling can come and go at any time. Vertigo often goes away on its own. This condition can be dangerous if it happens when you are doing activities like driving or working with machines. Your doctor will do tests to find the cause of your vertigo. These tests will also help your doctor decide on the best treatment for you. Follow these instructions at home: Eating and drinking     Drink enough fluid to keep your pee (urine) pale yellow. Do not drink alcohol. Activity Return to your normal activities when your doctor says that it is safe. In the morning, first sit up on the side of the bed. When you feel okay, stand slowly while you hold onto something until you know that your balance is fine. Move slowly. Avoid sudden body or head movements or certain positions, as told by your doctor. Use a cane if you have trouble standing or walking. Sit down right away if you feel dizzy. Avoid doing any tasks or activities that can cause danger to you or others if you get dizzy. Avoid bending down if you feel dizzy. Place items in your home so that they are easy for you to reach without bending or leaning over. Do not drive or use machinery if you feel dizzy. General instructions Take over-the-counter and prescription medicines only as told by your doctor. Keep all follow-up visits. Contact a doctor if: Your medicine does not help your vertigo. Your problems get worse or you have new symptoms. You have a fever. You feel like you may vomit (nauseous), or this feeling gets worse. You start to vomit. Your family or friends see changes in how you act. You lose feeling (have numbness) in part of your body. You feel prickling and tingling in a part of your body. Get help right away if: You are always dizzy. You faint. You get very bad headaches. You get a stiff neck. Bright light starts to bother  you. You have trouble moving or talking. You feel weak in your hands, arms, or legs. You have changes in your hearing or in how you see (vision). These symptoms may be an emergency. Get help right away. Call your local emergency services (911 in the [place] Do not wait to see if the symptoms will go away. Do not drive yourself to the hospital. Summary Vertigo is the feeling that you or the things around you are moving when they are not. Your doctor will do tests to find the cause of your vertigo. You may be told to avoid some tasks, positions, or movements. Contact a doctor if your medicine is not helping, or if you have a fever, new symptoms, or a change in how you act. Get help right away if you get very bad headaches, or if you have changes in how you speak, hear, or see. This information is not intended to replace advice given to you by your health care provider. Make sure you discuss any questions you have with your health care provider. Document Revised: 12/31/2019 Document Reviewed: 12/31/2019 Elsevier Patient Education  2024 [employer] How to Perform the Epley Maneuver The Epley maneuver is an exercise that relieves symptoms of vertigo. Vertigo is the feeling that you or your surroundings are moving when they are not. When you feel vertigo, you may feel like the room is spinning and may have trouble walking. The Epley maneuver is  used for a type of vertigo caused by a calcium deposit in a part of the inner ear. The maneuver involves changing head positions to help the deposit move out of the area. You can do this maneuver at home whenever you have symptoms of vertigo. You can repeat it in 24 hours if your vertigo has not gone away. Even though the Epley maneuver may relieve your vertigo for a few weeks, it is possible that your symptoms will return. This maneuver relieves vertigo, but it does not relieve dizziness. What are the risks? If it is done correctly, the Epley maneuver is  considered safe. Sometimes it can lead to dizziness or nausea that goes away after a short time. If you develop other symptoms--such as changes in vision, weakness, or numbness--stop doing the maneuver and call your health care provider. Supplies needed: A bed or table. A pillow. How to do the Epley maneuver     Sit on the edge of a bed or table with your back straight and your legs extended or hanging over the edge of the bed or table. Turn your head halfway toward the affected ear or side as told by your health care provider. Lie backward quickly with your head turned until you are lying flat on your back. Your head should dangle (head-hanging position). You may want to position a pillow under your shoulders. Hold this position for at least 30 seconds. If you feel dizzy or have symptoms of vertigo, continue to hold the position until the symptoms stop. Turn your head to the opposite direction until your unaffected ear is facing down. Your head should continue to dangle. Hold this position for at least 30 seconds. If you feel dizzy or have symptoms of vertigo, continue to hold the position until the symptoms stop. Turn your whole body to the same side as your head so that you are positioned on your side. Your head will now be nearly facedown and no longer needs to dangle. Hold for at least 30 seconds. If you feel dizzy or have symptoms of vertigo, continue to hold the position until the symptoms stop. Sit back up. You can repeat the maneuver in 24 hours if your vertigo does not go away. Follow these instructions at home: For 24 hours after doing the Epley maneuver: Keep your head in an upright position. When lying down to sleep or rest, keep your head raised (elevated) with two or more pillows. Avoid excessive neck movements. Activity Do not drive or use machinery if you feel dizzy. After doing the Epley maneuver, return to your normal activities as told by your health care provider. Ask  your health care provider what activities are safe for you. General instructions Drink enough fluid to keep your urine pale yellow. Do not drink alcohol. Take over-the-counter and prescription medicines only as told by your health care provider. Keep all follow-up visits. This is important. Preventing vertigo symptoms Ask your health care provider if there is anything you should do at home to prevent vertigo. He or she may recommend that you: Keep your head elevated with two or more pillows while you sleep. Do not sleep on the side of your affected ear. Get up slowly from bed. Avoid sudden movements during the day. Avoid extreme head positions or movement, such as looking up or bending over. Contact a health care provider if: Your vertigo gets worse. You have other symptoms, including: Nausea. Vomiting. Headache. Get help right away if you: Have vision changes. Have a  headache or neck pain that is severe or getting worse. Cannot stop vomiting. Have new numbness or weakness in any part of your body. These symptoms may represent a serious problem that is an emergency. Do not wait to see if the symptoms will go away. Get medical help right away. Call your local emergency services (911 in the [place] Do not drive yourself to the hospital. Summary Vertigo is the feeling that you or your surroundings are moving when they are not. The Epley maneuver is an exercise that relieves symptoms of vertigo. If the Epley maneuver is done correctly, it is considered safe. This information is not intended to replace advice given to you by your health care provider. Make sure you discuss any questions you have with your health care provider. Document Revised: 12/31/2019 Document Reviewed: 12/31/2019 Elsevier Patient Education  2024 [employer].

## 2023-03-07 ENCOUNTER — Ambulatory Visit: Payer: Medicaid Other | Admitting: Internal Medicine

## 2023-03-07 ENCOUNTER — Other Ambulatory Visit: Payer: Self-pay | Admitting: Physician Assistant

## 2023-03-07 DIAGNOSIS — Z1231 Encounter for screening mammogram for malignant neoplasm of breast: Secondary | ICD-10-CM

## 2023-03-07 NOTE — Progress Notes (Deleted)
Office Visit Note  Patient: Jody Spencer             Date of Birth: 1960/03/10           MRN: [id number]             PCP: Kathreen Cornfield, PA-C Referring: Kathreen Cornfield, PA-C Visit Date: 03/07/2023   Subjective:  No chief complaint on file.   History of Present Illness: Jody Spencer is a 63 y.o. female here for follow up ***   Previous HPI 01/08/23 [name] is a 63 y.o. female here for evaluation of chronic joint pain with a history of generalized osteoarthritis and ankylosing spondylitis.  She was previously diagnosed with AS by Dr. 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  The pain has been constant and daily, with the fingers affected for several years and the knees for approximately twelve years. The back pain was initially attributed to a poor mattress, but subsequent imaging revealed fusion of the bones. She was also found to have degenerative joint disease in multiple areas including the cervical and lumbar spine, hands, and bilateral knees especially medial compartment.  She had tried multiple NSAIDs and was initially treated with course of oral steroids with temporary symptom benefit.  Follow-up testing was negative for serum inflammatory markers or HLA-B27 allele.  She started on treatment with Humira for the suspected AS she took this for a few months but did not see a significant improvement in arthritis symptoms.  She did notice increase in swollen bumps in the skin especially in axillary and groin regions these were frequently painful.  She saw awake spine specialist and was treated with steroid injection these were temporarily helpful but benefit has degraded over a month. The patient has also been using a TENS unit for knee pain. Follow-up with both offices was interrupted by loss of her Medicaid insurance she recently regained insurance coverage with marketplace commercial product and so is  getting reestablished for her chronic medical conditions. The patient has been managing the arthritis pain with over-the-counter medications such as Tylenol and ibuprofen, but reports that ibuprofen causes stomach discomfort.   The patient also reports a hunch in the back, which has been noticed by family members. The patient has been attending physical therapy for four years, but had to stop due to a lapse in Medicaid coverage. The patient has recently obtained insurance through the marketplace and is considering resuming physical therapy.      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   PMFS History:  Patient Active Problem List   Diagnosis Date Noted   Vertigo 02/26/2023   Nausea 02/26/2023   Nystagmus on abduction 02/26/2023   Ankylosing spondylitis (HCC) 04/17/2022   Insomnia 04/17/2022   Multiple joint pain 04/17/2022   Overactive bladder 04/17/2022   Arthritis 12/15/2021   Chronic pain 12/15/2021   Mixed anxiety and depressive disorder 12/15/2021   Obesity 12/15/2021    Past Medical History:  Diagnosis Date   Anxiety    Arthritis     Family History  Problem Relation Age of Onset   Esophageal cancer Mother    Esophageal cancer Brother    Breast cancer Cousin    Colon cancer Neg Hx    Colon polyps Neg Hx    Rectal cancer Neg Hx    Stomach cancer Neg Hx  Past Surgical History:  Procedure Laterality Date   back injections     Social History   Social History Narrative   ** Merged History Encounter **       Right handed    Reader glasses   Rare coffee usage    There is no immunization history on file for this patient.   Objective: Vital Signs: There were no vitals taken for this visit.   Physical Exam   Musculoskeletal Exam: ***  CDAI Exam: CDAI Score: -- Patient Global: --; Provider Global: -- Swollen: --; Tender: -- Joint Exam 03/07/2023   No  joint exam has been documented for this visit   There is currently no information documented on the homunculus. Go to the Rheumatology activity and complete the homunculus joint exam.  Investigation: No additional findings.  Imaging: No results found.  Recent Labs: Lab Results  Component Value Date   WBC 5.2 01/31/2023   HGB 13.3 01/31/2023   PLT 200 01/31/2023   NA 137 01/31/2023   K 3.7 01/31/2023   CL 106 01/31/2023   CO2 25 01/31/2023   GLUCOSE 116 (H) 01/31/2023   BUN 10 01/31/2023   CREATININE 0.54 01/31/2023   BILITOT 0.4 01/31/2023   ALKPHOS 65 01/31/2023   AST 23 01/31/2023   ALT 19 01/31/2023   PROT 6.1 (L) 01/31/2023   ALBUMIN 3.5 01/31/2023   CALCIUM 8.6 (L) 01/31/2023   GFRAA >60 08/23/2016    Speciality Comments: No specialty comments available.  Procedures:  No procedures performed Allergies: Patient has no known allergies.   Assessment / Plan:     Visit Diagnoses: No diagnosis found.  ***  Orders: No orders of the defined types were placed in this encounter.  No orders of the defined types were placed in this encounter.    Follow-Up Instructions: No follow-ups on file.   Fuller Plan, MD  Note - This record has been created using [employer].  Chart creation errors have been sought, but may not always  have been located. Such creation errors do not reflect on  the standard of medical care.

## 2023-03-12 ENCOUNTER — Encounter: Payer: Self-pay | Admitting: Neurology

## 2023-03-15 ENCOUNTER — Ambulatory Visit
Admission: RE | Admit: 2023-03-15 | Discharge: 2023-03-15 | Disposition: A | Discharge status: Home / Self Care | Payer: Medicaid Other | Source: Ambulatory Visit | Attending: Neurology | Admitting: Neurology

## 2023-03-15 DIAGNOSIS — R42 Dizziness and giddiness: Secondary | ICD-10-CM

## 2023-03-15 DIAGNOSIS — H5509 Other forms of nystagmus: Secondary | ICD-10-CM | POA: Diagnosis not present

## 2023-03-15 DIAGNOSIS — R110 Nausea: Secondary | ICD-10-CM

## 2023-03-15 MED ORDER — GADOPICLENOL 0.5 MMOL/ML IV SOLN
10.0000 mL | Freq: Once | INTRAVENOUS | Status: AC | PRN
  Administered 2023-03-15: 10 mL via INTRAVENOUS

## 2023-03-16 ENCOUNTER — Encounter: Payer: Self-pay | Admitting: Neurology

## 2023-04-16 ENCOUNTER — Ambulatory Visit
Admission: RE | Admit: 2023-04-16 | Discharge: 2023-04-16 | Disposition: A | Discharge status: Home / Self Care | Payer: Medicaid Other | Source: Ambulatory Visit | Attending: Physician Assistant | Admitting: Physician Assistant

## 2023-04-16 DIAGNOSIS — Z1231 Encounter for screening mammogram for malignant neoplasm of breast: Secondary | ICD-10-CM

## 2024-03-13 ENCOUNTER — Ambulatory Visit
Admission: RE | Admit: 2024-03-13 | Discharge: 2024-03-13 | Disposition: A | Discharge status: Home / Self Care | Source: Ambulatory Visit

## 2024-03-13 ENCOUNTER — Other Ambulatory Visit: Payer: Self-pay

## 2024-03-13 DIAGNOSIS — R062 Wheezing: Secondary | ICD-10-CM
